# Patient Record
Sex: Male | Born: 1995 | Race: White | Hispanic: No | Marital: Single | Smoking: Current every day smoker
Health system: Southern US, Community
[De-identification: ages and names within clinical notes are randomized; demographics above are authoritative.]

## PROBLEM LIST (undated history)

## (undated) DIAGNOSIS — A4902 Methicillin resistant Staphylococcus aureus infection, unspecified site: Secondary | ICD-10-CM

## (undated) HISTORY — PX: TYMPANOSTOMY: SHX2586

---
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
## 2017-01-28 ENCOUNTER — Encounter (INDEPENDENT_AMBULATORY_CARE_PROVIDER_SITE_OTHER): Payer: Self-pay | Admitting: Orthopaedic Surgery

## 2017-01-28 ENCOUNTER — Ambulatory Visit (INDEPENDENT_AMBULATORY_CARE_PROVIDER_SITE_OTHER): Payer: Managed Care, Other (non HMO)

## 2017-01-28 ENCOUNTER — Ambulatory Visit (INDEPENDENT_AMBULATORY_CARE_PROVIDER_SITE_OTHER): Payer: Self-pay

## 2017-01-28 ENCOUNTER — Ambulatory Visit (INDEPENDENT_AMBULATORY_CARE_PROVIDER_SITE_OTHER): Payer: Managed Care, Other (non HMO) | Admitting: Orthopaedic Surgery

## 2017-01-28 VITALS — BP 97/63 | HR 82 | Ht 73.0 in | Wt 155.0 lb

## 2017-01-28 DIAGNOSIS — M25531 Pain in right wrist: Secondary | ICD-10-CM | POA: Diagnosis not present

## 2017-01-28 DIAGNOSIS — Z0189 Encounter for other specified special examinations: Secondary | ICD-10-CM
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
## 2017-02-02 ENCOUNTER — Encounter: Payer: Self-pay | Admitting: Orthopaedic Surgery

## 2017-02-04 ENCOUNTER — Ambulatory Visit (INDEPENDENT_AMBULATORY_CARE_PROVIDER_SITE_OTHER): Payer: Medicaid Other | Admitting: Orthopaedic Surgery

## 2017-02-05 ENCOUNTER — Telehealth (INDEPENDENT_AMBULATORY_CARE_PROVIDER_SITE_OTHER): Payer: Self-pay | Admitting: Orthopaedic Surgery

## 2017-02-05 MED ORDER — ACETAMINOPHEN-CODEINE #3 300-30 MG PO TABS: 1.0000 | ORAL_TABLET | Freq: Four times a day (QID) | ORAL | 0 refills | Status: DC | PRN

## 2017-02-05 NOTE — Telephone Encounter (Signed)
Called to pharmacy. Patient advised. He has not heard from Dr. Merlyn Lot office. I will call them about appt on Monday.

## 2017-02-05 NOTE — Telephone Encounter (Signed)
I left voicemail for patient. Steward Drone from the [facility] sent patient's information to hand center yesterday for emergent appt per Dr. Ophelia Charter. I need to know from patient if he has been scheduled there yet, etc.

## 2017-02-05 NOTE — Telephone Encounter (Signed)
Ok to call in tylenol # 3    # 20  1 po q 6 hrs prn pain  . thanks

## 2017-02-05 NOTE — Telephone Encounter (Signed)
Unable to reach patient at number left. I called [facility] at [phone] and office was closed for the day. Please advise on medication.

## 2017-02-05 NOTE — Addendum Note (Signed)
Addended by: Rogers Seeds on: 02/05/2017 05:09 PM   Modules accepted: Orders

## 2017-02-05 NOTE — Telephone Encounter (Signed)
PT ASKED IF THERE'S ANYTHING HE CAN HAVE FOR PAIN.  PLEASE ADVISE.  [phone]

## 2017-02-17 ENCOUNTER — Telehealth (INDEPENDENT_AMBULATORY_CARE_PROVIDER_SITE_OTHER): Payer: Self-pay | Admitting: *Deleted

## 2017-02-17 NOTE — Telephone Encounter (Signed)
Received call from [facility] stating pt was seen for MRI on Aug 13 that was ordered from the [facility] office by Dr. Ophelia Charter and needed an authorization from [employer], I submitted authorization which went into medical review with case # [id number], all pertinent information was uploaded and sent to [employer]. Pending auth.   Victorino Dike would like authorization to be send to her once authorized to [phone]

## 2017-02-23 NOTE — Telephone Encounter (Signed)
Received fax from [employer] stating MRI upper extremity RT is not covered, services was not medically necessary, states pt has to have at least 6 weeks trial of treatment, which includes, RICE, Drugs, oral steroids or injected steriods, or at home exercise.

## 2017-03-11 ENCOUNTER — Other Ambulatory Visit: Payer: Self-pay | Admitting: Orthopedic Surgery

## 2017-03-11 DIAGNOSIS — M25332 Other instability, left wrist: Secondary | ICD-10-CM

## 2017-03-11 DIAGNOSIS — M25331 Other instability, right wrist: Secondary | ICD-10-CM

## 2017-03-19 ENCOUNTER — Other Ambulatory Visit: Payer: Self-pay

## 2017-11-29 ENCOUNTER — Other Ambulatory Visit: Payer: Self-pay

## 2017-11-29 ENCOUNTER — Emergency Department (HOSPITAL_COMMUNITY)
Admission: EM | Admit: 2017-11-29 | Discharge: 2017-11-30 | Disposition: A | Discharge status: Home / Self Care | Payer: Managed Care, Other (non HMO) | Attending: Emergency Medicine | Admitting: Emergency Medicine

## 2017-11-29 ENCOUNTER — Encounter (HOSPITAL_COMMUNITY): Payer: Self-pay | Admitting: Emergency Medicine

## 2017-11-29 DIAGNOSIS — Y999 Unspecified external cause status: Secondary | ICD-10-CM | POA: Diagnosis not present

## 2017-11-29 DIAGNOSIS — S93402A Sprain of unspecified ligament of left ankle, initial encounter: Secondary | ICD-10-CM | POA: Diagnosis not present

## 2017-11-29 DIAGNOSIS — S99912A Unspecified injury of left ankle, initial encounter: Secondary | ICD-10-CM | POA: Diagnosis present

## 2017-11-29 DIAGNOSIS — Y9301 Activity, walking, marching and hiking: Secondary | ICD-10-CM | POA: Insufficient documentation

## 2017-11-29 DIAGNOSIS — F17210 Nicotine dependence, cigarettes, uncomplicated: Secondary | ICD-10-CM | POA: Diagnosis not present

## 2017-11-29 DIAGNOSIS — X500XXA Overexertion from strenuous movement or load, initial encounter: Secondary | ICD-10-CM | POA: Diagnosis not present

## 2017-11-29 DIAGNOSIS — Z79899 Other long term (current) drug therapy: Secondary | ICD-10-CM | POA: Diagnosis not present

## 2017-11-29 DIAGNOSIS — Y92832 Beach as the place of occurrence of the external cause: Secondary | ICD-10-CM | POA: Diagnosis not present
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
## 2017-11-30 ENCOUNTER — Emergency Department (HOSPITAL_COMMUNITY): Payer: Managed Care, Other (non HMO)

## 2017-11-30 DIAGNOSIS — S93402A Sprain of unspecified ligament of left ankle, initial encounter: Secondary | ICD-10-CM | POA: Diagnosis not present

## 2017-11-30 MED ORDER — HYDROCODONE-ACETAMINOPHEN 5-325 MG PO TABS
1.0000 | ORAL_TABLET | Freq: Once | ORAL | Status: AC
  Administered 2017-11-30: 1 via ORAL
  Filled 2017-11-30: qty 1

## 2017-11-30 MED ORDER — HYDROCODONE-ACETAMINOPHEN 5-325 MG PO TABS: ORAL_TABLET | ORAL | 0 refills | Status: DC

## 2017-11-30 NOTE — ED Provider Notes (Signed)
[facility] EMERGENCY DEPARTMENT Provider Note   CSN: [id number] Arrival date & time: 11/29/17  2213     History   Chief Complaint Chief Complaint  Patient presents with  . Ankle Pain    HPI Michael Welch is a 22 y.o. male.  HPI   Michael Welch is a 22 y.o. male who presents to the Emergency Department complaining of left ankle pain and swelling for one week.  He developed pain to his left ankle after walking [place] the beach.  He was seen at another ER and told he had a cellulitis to his ankle and given antibiotic and ibuprofen which have not helped his symptoms.  He has continued to walk and work, and now complains of increased pain to the ankle and swelling.  He denies open wounds or sore to the foot, numbness, calf pain or swelling    History reviewed. No pertinent past medical history.  Patient Active Problem List   Diagnosis Date Noted  . Right wrist pain 01/28/2017    History reviewed. No pertinent surgical history.      Home Medications    Prior to Admission medications   Medication Sig Start Date End Date Taking? Authorizing Provider  acetaminophen-codeine (TYLENOL #3) 300-30 MG tablet Take 1 tablet by mouth every 6 (six) hours as needed for moderate pain. 02/05/17   Eldred Manges, MD  amphetamine-dextroamphetamine (ADDERALL XR) 30 MG 24 hr capsule Take 30 mg by mouth daily.    [provider]  cephALEXin (KEFLEX) 500 MG capsule 1 tab po qid x 7 days Patient not taking: Reported on 11/19/2014 02/10/13   Viviano Simas, NP  HYDROcodone-acetaminophen (NORCO/VICODIN) 5-325 MG per tablet Take 1 tablet by mouth every 4 (four) hours as needed. 11/20/14   Janne Napoleon, NP  naproxen (NAPROSYN) 500 MG tablet Take 1 tablet (500 mg total) by mouth 2 (two) times daily. Patient not taking: Reported on 01/28/2017 11/20/14   Janne Napoleon, NP    Family History History reviewed. No pertinent family history.  Social History Social History   Tobacco Use    . Smoking status: Current Every Day Smoker    Packs/day: 0.50  . Smokeless tobacco: Never Used  Substance Use Topics  . Alcohol use: No  . Drug use: No     Allergies   Sulfa antibiotics   Review of Systems Review of Systems  Constitutional: Negative for chills and fever.  Musculoskeletal: Positive for arthralgias (left ankle pain and swelling) and joint swelling.  Skin: Negative for color change and wound.  Neurological: Negative for weakness and numbness.  All other systems reviewed and are negative.    Physical Exam Updated Vital Signs BP 134/84 (BP Location: Right Arm)   Pulse 90   Temp 98.5 F (36.9 C) (Oral)   Resp 16   Ht 6\' 1"  (1.854 m)   Wt 76.2 kg (168 lb)   SpO2 98%   BMI 22.16 kg/m   Physical Exam  Constitutional: He is oriented to person, place, and time. He appears well-developed and well-nourished. No distress.  HENT:  Head: Normocephalic and atraumatic.  Cardiovascular: Normal rate, regular rhythm and intact distal pulses.  Pulmonary/Chest: Effort normal and breath sounds normal.  Musculoskeletal: He exhibits edema and tenderness. He exhibits no deformity.  Diffuse tenderness of the medial and lateral ankle.  Mild erythema of the lateral ankle w/o excessive warmth.  No open wounds or lymphangitis.  No proximal tenderness. Compartments are soft.  Neurological: He is alert and oriented to person, place, and time. He exhibits normal muscle tone. Coordination normal.  Skin: Skin is warm and dry. Capillary refill takes less than 2 seconds.  Nursing note and vitals reviewed.    ED Treatments / Results  Labs (all labs ordered are listed, but only abnormal results are displayed) Labs Reviewed - No data to display  EKG None  Radiology Dg Ankle Complete Left  Result Date: 11/30/2017 CLINICAL DATA:  Left ankle pain for 1 week.  Swelling. EXAM: LEFT ANKLE COMPLETE - 3+ VIEW COMPARISON:  [facility]. FINDINGS: There is no  evidence of fracture or dislocation. Ankle mortise is preserved. Joint effusion is now visualized. There is no evidence of arthropathy or other focal bone abnormality. Mild soft tissue edema compared to prior exam. IMPRESSION: Joint effusion and soft tissue edema.  No acute osseous abnormality. Electronically Signed   By: Rubye OaksMelanie  Ehinger M.D.   On: 11/30/2017 01:03    Procedures Procedures (including critical care time)  Medications Ordered in ED Medications  HYDROcodone-acetaminophen (NORCO/VICODIN) 5-325 MG per tablet 1 tablet (has no administration in time range)     Initial Impression / Assessment and Plan / ED Course  I have reviewed the triage vital signs and the nursing notes.  Pertinent labs & imaging results that were available during my care of the patient were reviewed by me and considered in my medical decision making (see chart for details).     XR here neg for fx. but findings show effusion which may suggest sprain.  NV intact.  No proximal edema or tenderness.  Placed in ASO and crutches given.  He agrees to RICE therapy and close orthopedic f/u in one week if not improving  Final Clinical Impressions(s) / ED Diagnoses   Final diagnoses:  Sprain of left ankle, unspecified ligament, initial encounter    ED Discharge Orders    None       Pauline Ausriplett, Deneane Stifter, PA-C 11/30/17 0137    Devoria AlbeKnapp, Iva, MD 11/30/17 [phone]

## 2017-11-30 NOTE — Discharge Instructions (Addendum)
Elevate and apply ice packs on/off to your ankle.  Use the crutches for weight bearing.  Call Dr. Mort SawyersHarrison's office in one week if not improving

## 2017-12-04 ENCOUNTER — Emergency Department (HOSPITAL_COMMUNITY): Payer: Managed Care, Other (non HMO)

## 2017-12-04 ENCOUNTER — Emergency Department (HOSPITAL_COMMUNITY)
Admission: EM | Admit: 2017-12-04 | Discharge: 2017-12-05 | Disposition: A | Discharge status: Home / Self Care | Payer: Managed Care, Other (non HMO) | Attending: Emergency Medicine | Admitting: Emergency Medicine

## 2017-12-04 ENCOUNTER — Other Ambulatory Visit: Payer: Self-pay

## 2017-12-04 ENCOUNTER — Encounter (HOSPITAL_COMMUNITY): Payer: Self-pay | Admitting: Emergency Medicine

## 2017-12-04 DIAGNOSIS — M25572 Pain in left ankle and joints of left foot: Secondary | ICD-10-CM | POA: Insufficient documentation

## 2017-12-04 DIAGNOSIS — M25472 Effusion, left ankle: Secondary | ICD-10-CM

## 2017-12-04 DIAGNOSIS — F17210 Nicotine dependence, cigarettes, uncomplicated: Secondary | ICD-10-CM | POA: Diagnosis not present

## 2017-12-04 DIAGNOSIS — R2242 Localized swelling, mass and lump, left lower limb: Secondary | ICD-10-CM | POA: Diagnosis not present

## 2017-12-04 LAB — CBC WITH DIFFERENTIAL/PLATELET
BASOS PCT: 0 %
Basophils Absolute: 0 10*3/uL (ref 0.0–0.1)
EOS ABS: 0.1 10*3/uL (ref 0.0–0.7)
Eosinophils Relative: 1 %
HCT: 43 % (ref 39.0–52.0)
HEMOGLOBIN: 14.8 g/dL (ref 13.0–17.0)
Lymphocytes Relative: 20 %
Lymphs Abs: 1.9 10*3/uL (ref 0.7–4.0)
MCH: 31.2 pg (ref 26.0–34.0)
MCHC: 34.4 g/dL (ref 30.0–36.0)
MCV: 90.5 fL (ref 78.0–100.0)
MONOS PCT: 13 %
Monocytes Absolute: 1.3 10*3/uL — ABNORMAL HIGH (ref 0.1–1.0)
NEUTROS PCT: 66 %
Neutro Abs: 6.3 10*3/uL (ref 1.7–7.7)
Platelets: 268 10*3/uL (ref 150–400)
RBC: 4.75 MIL/uL (ref 4.22–5.81)
RDW: 12.7 % (ref 11.5–15.5)
WBC: 9.6 10*3/uL (ref 4.0–10.5)

## 2017-12-04 LAB — BASIC METABOLIC PANEL
Anion gap: 10 (ref 5–15)
BUN: 9 mg/dL (ref 6–20)
CHLORIDE: 99 mmol/L — AB (ref 101–111)
CO2: 30 mmol/L (ref 22–32)
CREATININE: 0.83 mg/dL (ref 0.61–1.24)
Calcium: 9.7 mg/dL (ref 8.9–10.3)
GFR calc non Af Amer: >60 mL/min (ref >60)
Glucose, Bld: 87 mg/dL (ref 65–99)
Potassium: 3.7 mmol/L (ref 3.5–5.1)
SODIUM: 139 mmol/L (ref 135–145)

## 2017-12-04 LAB — URIC ACID: URIC ACID, SERUM: 6.3 mg/dL (ref 4.4–7.6)

## 2017-12-04 MED ORDER — HYDROCODONE-ACETAMINOPHEN 5-325 MG PO TABS
1.0000 | ORAL_TABLET | Freq: Once | ORAL | Status: AC
  Administered 2017-12-04: 1 via ORAL
  Filled 2017-12-04: qty 1

## 2017-12-04 NOTE — ED Triage Notes (Signed)
Pt c/o foot pain and swelling to left foot since 11/23/17, [facility] and here for same, pt has taken all of Doxycycline, left foot has continued to swell and be painful with no relief

## 2017-12-04 NOTE — ED Notes (Signed)
Pt with continued swelling to left foot and ankle, redness noted to left ankle.  Pt states took last dose of doxycycline today and believes it was a 10 day course.  Unable to state if fever at home.

## 2017-12-04 NOTE — ED Provider Notes (Signed)
[facility] EMERGENCY DEPARTMENT Provider Note   CSN: [id number] Arrival date & time: 12/04/17  2126     History   Chief Complaint Chief Complaint  Patient presents with  . Foot Pain    HPI Michael Welch is a 22 y.o. male.  The history is provided by the patient.  He comes in with ongoing pain and swelling in the left foot and ankle.  He had onset pain and swelling in his left ankle about 1 week ago.  [place], [place], noted that there was some pain and swelling in his ankle.  [facility] where x-rays of his ankle were negative and he was diagnosed with cellulitis and started on cephalexin.  He came here 5 days ago because of lack of improvement, x-rays were again negative, no change in antibiotics at that time.  Since then, he states the pain and swelling of back to gotten worse.  He rates pain at 10/10.  Is worse with palpation and with bearing weight.  He denies fever or chills, but he did have sweats last night.  He has been taking ibuprofen and a muscle relaxer, which only gives him slight relief.  History reviewed. No pertinent past medical history.  Patient Active Problem List   Diagnosis Date Noted  . Right wrist pain 01/28/2017    History reviewed. No pertinent surgical history.      Home Medications    Prior to Admission medications   Medication Sig Start Date End Date Taking? Authorizing Provider  amphetamine-dextroamphetamine (ADDERALL XR) 30 MG 24 hr capsule Take 30 mg by mouth daily.    [provider]  cephALEXin (KEFLEX) 500 MG capsule 1 tab po qid x 7 days Patient not taking: Reported on 11/19/2014 02/10/13   Viviano Simasobinson, Lauren, NP  HYDROcodone-acetaminophen (NORCO/VICODIN) 5-325 MG tablet Take one tab po q 4 hrs prn pain 11/30/17   Pauline Ausriplett, Tammy, PA-C    Family History History reviewed. No pertinent family history.  Social History Social History   Tobacco Use  . Smoking status: Current Every  Day Smoker    Packs/day: 0.50  . Smokeless tobacco: Never Used  Substance Use Topics  . Alcohol use: No  . Drug use: No     Allergies   Sulfa antibiotics   Review of Systems Review of Systems  All other systems reviewed and are negative.    Physical Exam Updated Vital Signs BP 127/79 (BP Location: Right Arm)   Pulse 90   Temp 98.6 F (37 C) (Oral)   Ht 6\' 1"  (1.854 m)   Wt 77.1 kg (170 lb)   SpO2 99%   BMI 22.43 kg/m   Physical Exam  Nursing note and vitals reviewed.  22 year old male, resting comfortably and in no acute distress. Vital signs are normal. Oxygen saturation is 99%, which is normal. Head is normocephalic and atraumatic. PERRLA, EOMI. Oropharynx is clear. Neck is nontender and supple without adenopathy or JVD. Back is nontender and there is no CVA tenderness. Lungs are clear without rales, wheezes, or rhonchi. Chest is nontender. Heart has regular rate and rhythm without murmur. Abdomen is soft, flat, nontender without masses or hepatosplenomegaly and peristalsis is normoactive. Extremities: Mild to moderate soft tissue swelling of the left foot and ankle.  Mild erythema over most of the left foot and ankle with moderate erythema laterally.  There is slight warmth.  Left foot and ankle are tender diffusely.  There are no lymphangitic streaks.  There is no calf swelling or tenderness. Skin is warm and dry without rash. Neurologic: Mental status is normal, cranial nerves are intact, there are no motor or sensory deficits.  ED Treatments / Results  Labs (all labs ordered are listed, but only abnormal results are displayed) Labs Reviewed  CBC WITH DIFFERENTIAL/PLATELET - Abnormal; Notable for the following components:      Result Value   Monocytes Absolute 1.3 (*)    All other components within normal limits  BASIC METABOLIC PANEL - Abnormal; Notable for the following components:   Chloride 99 (*)    All other components within normal limits    SEDIMENTATION RATE - Abnormal; Notable for the following components:   Sed Rate 26 (*)    All other components within normal limits  URIC ACID   Radiology Dg Foot Complete Left  Result Date: 12/04/2017 CLINICAL DATA:  Pain and swelling in the foot EXAM: LEFT FOOT - COMPLETE 3+ VIEW COMPARISON:  11/30/2017 FINDINGS: There is no evidence of fracture or dislocation. There is no evidence of arthropathy or other focal bone abnormality. Soft tissues are unremarkable. IMPRESSION: Negative. Electronically Signed   By: Jasmine Pang M.D.   On: 12/04/2017 23:43    Procedures Procedures  Medications Ordered in ED Medications  clindamycin (CLEOCIN) capsule 300 mg (has no administration in time range)  predniSONE (DELTASONE) tablet 60 mg (has no administration in time range)  HYDROcodone-acetaminophen (NORCO/VICODIN) 5-325 MG per tablet 1 tablet (1 tablet Oral Given 12/04/17 2330)     Initial Impression / Assessment and Plan / ED Course  I have reviewed the triage vital signs and the nursing notes.  Pertinent labs & imaging results that were available during my care of the patient were reviewed by me and considered in my medical decision making (see chart for details).  Pain and swelling in the left foot and ankle which have not responded to what should have been appropriate antibiotics for cellulitis.  Old records are reviewed confirming ED visit 5 days ago at which time ankle x-rays were negative and he was discharged with prescription for hydrocodone-acetaminophen.  Of note, x-rays from 5 days ago did suggest joint effusion.  I wonder if this is actually gout.  Will check serum uric acid level and sedimentation rate.  We will also check x-ray of the foot, since but has not been x-rayed previously.  Today, CBC is normal with normal WBC and normal differential.  If this is cellulitis, he may need to be switched to an antibiotic that would be appropriate for MRSA.  Uric acid is normal.   Sedimentation rate is only minimally elevated.  Foot x-rays are unremarkable except for soft tissue swelling.  At this point, no indication for hospital admission.  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  He will be discharged with prescription for clindamycin as well as a short course of prednisone.  Given additional hydrocodone-acetaminophen to use for pain control until inflammation comes under control.  Advised to return to the emergency department if not improving in 2 days, otherwise, follow-up with PCP.  Final Clinical Impressions(s) / ED Diagnoses   Final diagnoses:  Pain and swelling of left ankle    ED Discharge Orders        Ordered    clindamycin (CLEOCIN) 300 MG capsule  4 times daily     12/05/17 0053  predniSONE (DELTASONE) 50 MG tablet  Daily     12/05/17 0053    HYDROcodone-acetaminophen (NORCO/VICODIN) 5-325 MG tablet     12/05/17 0053    HYDROcodone-acetaminophen (NORCO) 5-325 MG tablet  Every 4 hours PRN     12/05/17 0053       Dione Booze, MD 12/05/17 [phone]

## 2017-12-05 LAB — SEDIMENTATION RATE: Sed Rate: 26 mm/hr — ABNORMAL HIGH (ref 0–16)

## 2017-12-05 MED ORDER — HYDROCODONE-ACETAMINOPHEN 5-325 MG PO TABS: ORAL_TABLET | ORAL | 0 refills | Status: DC

## 2017-12-05 MED ORDER — PREDNISONE 50 MG PO TABS
60.0000 mg | ORAL_TABLET | Freq: Once | ORAL | Status: DC
  Filled 2017-12-05: qty 1

## 2017-12-05 MED ORDER — HYDROCODONE-ACETAMINOPHEN 5-325 MG PO TABS: 1.0000 | ORAL_TABLET | ORAL | 0 refills | Status: DC | PRN

## 2017-12-05 MED ORDER — PREDNISONE 50 MG PO TABS: 50.0000 mg | ORAL_TABLET | Freq: Every day | ORAL | 0 refills | Status: DC

## 2017-12-05 MED ORDER — CLINDAMYCIN HCL 300 MG PO CAPS: 300.0000 mg | ORAL_CAPSULE | Freq: Four times a day (QID) | ORAL | 0 refills | Status: DC

## 2017-12-05 MED ORDER — CLINDAMYCIN HCL 150 MG PO CAPS
300.0000 mg | ORAL_CAPSULE | Freq: Once | ORAL | Status: DC
  Filled 2017-12-05: qty 2

## 2017-12-05 NOTE — Discharge Instructions (Signed)
At this point, I am not sure if you truly have cellulitis (an infection in the skin) or gout.  Treatment is being given for both.  Please take the new antibiotic as prescribed, take the prednisone as prescribed.  If your foot is not showing any improvement in the next 2 days, return to the emergency department for consideration for hospital admission.

## 2017-12-06 ENCOUNTER — Encounter (HOSPITAL_COMMUNITY): Payer: Self-pay | Admitting: Emergency Medicine

## 2017-12-06 ENCOUNTER — Other Ambulatory Visit: Payer: Self-pay

## 2017-12-06 ENCOUNTER — Inpatient Hospital Stay (HOSPITAL_COMMUNITY)
Admission: EM | Admit: 2017-12-06 | Discharge: 2017-12-08 | DRG: 488 | Disposition: A | Discharge status: Home / Self Care | Payer: Managed Care, Other (non HMO) | Attending: Internal Medicine | Admitting: Internal Medicine

## 2017-12-06 DIAGNOSIS — A549 Gonococcal infection, unspecified: Secondary | ICD-10-CM | POA: Clinically undetermined

## 2017-12-06 DIAGNOSIS — Z882 Allergy status to sulfonamides status: Secondary | ICD-10-CM

## 2017-12-06 DIAGNOSIS — M130 Polyarthritis, unspecified: Secondary | ICD-10-CM | POA: Diagnosis not present

## 2017-12-06 DIAGNOSIS — R6510 Systemic inflammatory response syndrome (SIRS) of non-infectious origin without acute organ dysfunction: Secondary | ICD-10-CM | POA: Diagnosis present

## 2017-12-06 DIAGNOSIS — M25462 Effusion, left knee: Secondary | ICD-10-CM | POA: Diagnosis present

## 2017-12-06 DIAGNOSIS — M009 Pyogenic arthritis, unspecified: Secondary | ICD-10-CM | POA: Diagnosis not present

## 2017-12-06 DIAGNOSIS — R739 Hyperglycemia, unspecified: Secondary | ICD-10-CM | POA: Diagnosis present

## 2017-12-06 DIAGNOSIS — R7989 Other specified abnormal findings of blood chemistry: Secondary | ICD-10-CM

## 2017-12-06 DIAGNOSIS — M25569 Pain in unspecified knee: Secondary | ICD-10-CM

## 2017-12-06 DIAGNOSIS — Z8619 Personal history of other infectious and parasitic diseases: Secondary | ICD-10-CM

## 2017-12-06 DIAGNOSIS — M25472 Effusion, left ankle: Secondary | ICD-10-CM | POA: Diagnosis present

## 2017-12-06 DIAGNOSIS — F17210 Nicotine dependence, cigarettes, uncomplicated: Secondary | ICD-10-CM | POA: Diagnosis present

## 2017-12-06 DIAGNOSIS — K921 Melena: Secondary | ICD-10-CM | POA: Diagnosis present

## 2017-12-06 HISTORY — DX: Methicillin resistant Staphylococcus aureus infection, unspecified site: A49.02

## 2017-12-06 LAB — CBC WITH DIFFERENTIAL/PLATELET
Abs Immature Granulocytes: 0.1 10*3/uL (ref 0.0–0.1)
BASOS PCT: 0 %
Basophils Absolute: 0 10*3/uL (ref 0.0–0.1)
EOS ABS: 0 10*3/uL (ref 0.0–0.7)
EOS PCT: 0 %
HEMATOCRIT: 45.1 % (ref 39.0–52.0)
Hemoglobin: 14.8 g/dL (ref 13.0–17.0)
Immature Granulocytes: 0 %
LYMPHS ABS: 0.8 10*3/uL (ref 0.7–4.0)
Lymphocytes Relative: 6 %
MCH: 29.8 pg (ref 26.0–34.0)
MCHC: 32.8 g/dL (ref 30.0–36.0)
MCV: 90.7 fL (ref 78.0–100.0)
MONOS PCT: 6 %
Monocytes Absolute: 0.8 10*3/uL (ref 0.1–1.0)
Neutro Abs: 12.3 10*3/uL — ABNORMAL HIGH (ref 1.7–7.7)
Neutrophils Relative %: 88 %
Platelets: 331 10*3/uL (ref 150–400)
RBC: 4.97 MIL/uL (ref 4.22–5.81)
RDW: 12.4 % (ref 11.5–15.5)
WBC: 13.9 10*3/uL — AB (ref 4.0–10.5)

## 2017-12-06 LAB — COMPREHENSIVE METABOLIC PANEL
ALK PHOS: 63 U/L (ref 38–126)
ALT: 17 U/L (ref 17–63)
AST: 19 U/L (ref 15–41)
Albumin: 4.1 g/dL (ref 3.5–5.0)
Anion gap: 11 (ref 5–15)
BUN: 13 mg/dL (ref 6–20)
CALCIUM: 10.1 mg/dL (ref 8.9–10.3)
CO2: 28 mmol/L (ref 22–32)
CREATININE: 0.92 mg/dL (ref 0.61–1.24)
Chloride: 101 mmol/L (ref 101–111)
GFR calc Af Amer: >60 mL/min (ref >60)
Glucose, Bld: 156 mg/dL — ABNORMAL HIGH (ref 65–99)
Potassium: 4 mmol/L (ref 3.5–5.1)
Sodium: 140 mmol/L (ref 135–145)
Total Bilirubin: 0.9 mg/dL (ref 0.3–1.2)
Total Protein: 8 g/dL (ref 6.5–8.1)

## 2017-12-06 LAB — I-STAT CG4 LACTIC ACID, ED
Lactic Acid, Venous: 1.98 mmol/L — ABNORMAL HIGH (ref 0.5–1.9)
Lactic Acid, Venous: 2.81 mmol/L (ref 0.5–1.9)

## 2017-12-06 MED ORDER — VANCOMYCIN HCL IN DEXTROSE 1-5 GM/200ML-% IV SOLN: 1000.0000 mg | Freq: Once | INTRAVENOUS | Status: DC

## 2017-12-06 MED ORDER — VANCOMYCIN HCL 10 G IV SOLR
1500.0000 mg | Freq: Once | INTRAVENOUS | Status: AC
  Administered 2017-12-07: 1500 mg via INTRAVENOUS
  Filled 2017-12-06: qty 1500

## 2017-12-06 MED ORDER — PIPERACILLIN-TAZOBACTAM 3.375 G IVPB 30 MIN
3.3750 g | Freq: Once | INTRAVENOUS | Status: AC
  Administered 2017-12-07: 3.375 g via INTRAVENOUS
  Filled 2017-12-06: qty 50

## 2017-12-06 MED ORDER — SODIUM CHLORIDE 0.9 % IV BOLUS (SEPSIS)
500.0000 mL | Freq: Once | INTRAVENOUS | Status: AC
  Administered 2017-12-07: 500 mL via INTRAVENOUS

## 2017-12-06 MED ORDER — SODIUM CHLORIDE 0.9 % IV BOLUS (SEPSIS)
1000.0000 mL | Freq: Once | INTRAVENOUS | Status: AC
  Administered 2017-12-06: 1000 mL via INTRAVENOUS

## 2017-12-06 MED ORDER — SODIUM CHLORIDE 0.9 % IV BOLUS (SEPSIS)
1000.0000 mL | Freq: Once | INTRAVENOUS | Status: AC
  Administered 2017-12-07: 1000 mL via INTRAVENOUS

## 2017-12-06 MED FILL — Hydrocodone-Acetaminophen Tab 5-325 MG: ORAL | Qty: 6 | Status: AC

## 2017-12-06 NOTE — ED Notes (Signed)
Kim-RN @[facility]  notified of elevated CG-4 of 2.81

## 2017-12-06 NOTE — ED Triage Notes (Signed)
Pt presents with L leg pain and swelling X several days. Pt has been seem multiple times for same at [facility] [place] and [facility], dx with possible cellulitis vs gout vs join effusion. Pt given prednisone, abx, hydrocodone and instructed to follow up with ortho but has not. Pt states he feels like the prior hospitals "dont know what they are talking about"

## 2017-12-06 NOTE — ED Provider Notes (Signed)
[facility] Provider Note   CSN: [id number] Arrival date & time: 12/06/17  2105     History   Chief Complaint Chief Complaint  Patient presents with  . Leg Pain    HPI Michael Welch is a 22 y.o. male.  The history is provided by the patient.  He has been basically healthy until about 10 days ago when he developed pain in his left foot and ankle.  He went to [facility] emergency department at [facility] [place] where he was diagnosed with cellulitis and started on cephalexin.  He did not have any improvement and went to the emergency department at [facility] where he was treated for possible ankle sprain.  He returned to any pain emergency department where antibiotics were switched to doxycycline and he was also put on prednisone to treat possible gout.  Since that change, his foot has improved.  Erythema has gone down and swelling has gone down.  However, he now has pain and swelling and discoloration of his left knee.  He rates pain at 8/10.  He denies fever, chills, sweats.  He denies cough.  He denies vomiting or diarrhea.  Knee pain is worse with palpation and worse with movement.  History reviewed. No pertinent past medical history.  Patient Active Problem List   Diagnosis Date Noted  . Right wrist pain 01/28/2017    History reviewed. No pertinent surgical history.      Home Medications    Prior to Admission medications   Medication Sig Start Date End Date Taking? Authorizing Provider  amphetamine-dextroamphetamine (ADDERALL XR) 30 MG 24 hr capsule Take 30 mg by mouth daily.    [provider]  clindamycin (CLEOCIN) 300 MG capsule Take 1 capsule (300 mg total) by mouth 4 (four) times daily. X 7 days 12/05/17   Dione BoozeGlick, Olena Willy, MD  HYDROcodone-acetaminophen [facility](NORCO) 5-325 MG tablet Take 1 tablet by mouth every 4 (four) hours as needed for moderate pain. 12/05/17   Dione BoozeGlick, Devoiry Corriher, MD  HYDROcodone-acetaminophen (NORCO/VICODIN) 5-325 MG  tablet Take one tab po q 4 hrs prn pain Patient not taking: Reported on 12/06/2017 12/05/17   Dione BoozeGlick, Joliet Mallozzi, MD  predniSONE (DELTASONE) 50 MG tablet Take 1 tablet (50 mg total) by mouth daily. 12/05/17   Dione BoozeGlick, Beola Vasallo, MD    Family History No family history on file.  Social History Social History   Tobacco Use  . Smoking status: Current Every Day Smoker    Packs/day: 0.50  . Smokeless tobacco: Never Used  Substance Use Topics  . Alcohol use: No  . Drug use: No     Allergies   Sulfa antibiotics   Review of Systems Review of Systems  All other systems reviewed and are negative.    Physical Exam Updated Vital Signs BP 116/77 (BP Location: Right Arm)   Pulse (!) 119   Temp 98.3 F (36.8 C) (Oral)   Resp 18   Ht 6\' 1"  (1.854 m)   Wt 77.1 kg (170 lb)   SpO2 98%   BMI 22.43 kg/m   Physical Exam  Nursing note and vitals reviewed.  22 year old male, resting comfortably and in no acute distress. Vital signs are significant for tachycardia. Oxygen saturation is 98%, which is normal. Head is normocephalic and atraumatic. PERRLA, EOMI. Oropharynx is clear. Neck is nontender and supple without adenopathy or JVD. Back is nontender and there is no CVA tenderness. Lungs are clear without rales, wheezes, or rhonchi. Chest is nontender. Heart  has regular rate and rhythm without murmur. Abdomen is soft, flat, nontender without masses or hepatosplenomegaly and peristalsis is normoactive. Extremities: Discoloration noted around left knee with moderate effusion present.  Mild tenderness to palpation in the left knee.  Left ankle has mild swelling but no erythema or warmth.  It is minimally tender.  Swelling does extend into the left foot.  Dorsalis pedis pulses strong and capillary refill is prompt. Skin is warm and dry without rash. Neurologic: Mental status is normal, cranial nerves are intact, there are no motor or sensory deficits.  ED Treatments / Results  Labs (all labs ordered  are listed, but only abnormal results are displayed) Labs Reviewed  COMPREHENSIVE METABOLIC PANEL - Abnormal; Notable for the following components:      Result Value   Glucose, Bld 156 (*)    All other components within normal limits  CBC WITH DIFFERENTIAL/PLATELET - Abnormal; Notable for the following components:   WBC 13.9 (*)    Neutro Abs 12.3 (*)    All other components within normal limits  I-STAT CG4 LACTIC ACID, ED - Abnormal; Notable for the following components:   Lactic Acid, Venous 2.81 (*)    All other components within normal limits  CULTURE, BLOOD (ROUTINE X 2)  CULTURE, BLOOD (ROUTINE X 2)  URINALYSIS, ROUTINE W REFLEX MICROSCOPIC  I-STAT CG4 LACTIC ACID, ED    EKG EKG Interpretation  Date/Time:  Tuesday December 07 2017 00:39:23 EDT Ventricular Rate:  75 PR Interval:    QRS Duration: 91 QT Interval:  389 QTC Calculation: 435 R Axis:   77 Text Interpretation:  Atrial fibrillation Borderline Q waves in lateral leads When compared with ECG of 10/13/2010, QT has shortened Confirmed by Dione Booze ([id number]) on 12/07/2017 12:51:58 AM   Radiology Dg Foot Complete Left  Result Date: 12/04/2017 CLINICAL DATA:  Pain and swelling in the foot EXAM: LEFT FOOT - COMPLETE 3+ VIEW COMPARISON:  11/30/2017 FINDINGS: There is no evidence of fracture or dislocation. There is no evidence of arthropathy or other focal bone abnormality. Soft tissues are unremarkable. IMPRESSION: Negative. Electronically Signed   By: Jasmine Pang M.D.   On: 12/04/2017 23:43    Procedures .Joint Aspiration/Arthrocentesis Date/Time: 12/07/2017 12:49 AM Performed by: Dione Booze, MD Authorized by: Dione Booze, MD   Consent:    Consent obtained:  Verbal   Consent given by:  Patient   Risks discussed:  Bleeding, infection and pain   Alternatives discussed:  No treatment Location:    Location:  Knee   Knee:  L knee Anesthesia (see MAR for exact dosages):    Anesthesia method:  Local infiltration    Local anesthetic:  Lidocaine 1% w/o epi Procedure details:    Preparation: Patient was prepped and draped in usual sterile fashion     Needle gauge:  18 G   Ultrasound guidance: no     Approach:  Medial   Aspirate amount:  20 ml   Aspirate characteristics:  Cloudy   Steroid injected: no     Specimen collected: yes   Post-procedure details:    Dressing:  Adhesive bandage   Patient tolerance of procedure:  Tolerated with difficulty    Medications Ordered in ED Medications  sodium chloride 0.9 % bolus 1,000 mL (has no administration in time range)    And  sodium chloride 0.9 % bolus 1,000 mL (has no administration in time range)    And  sodium chloride 0.9 % bolus 500 mL (has no administration  in time range)  piperacillin-tazobactam (ZOSYN) IVPB 3.375 g (has no administration in time range)  vancomycin (VANCOCIN) 1,500 mg in sodium chloride 0.9 % 500 mL IVPB (has no administration in time range)     Initial Impression / Assessment and Plan / ED Course  I have reviewed the triage vital signs and the nursing notes.  Pertinent labs & imaging results that were available during my care of the patient were reviewed by me and considered in my medical decision making (see chart for details).  Migratory arthritis.  Lactic acid level had been ordered at triage and is mildly elevated and heart rate is elevated.  Will treat as possible sepsis because of elevated lactic acid, but suspect this is more of an immune process.  He will be admitted because of failure of outpatient management as well as elevated lactic acid level.  He is started on vancomycin and Zosyn.  Case is discussed with Dr. Toniann Fail of [facility], who agrees to admit the patient.  Requests arthrocentesis to be done prior to his seeing the patient.  Knee is aspirated with 18-gauge needle, 20 mL of cloudy fluid drawn and sent to the lab for testing.  Final Clinical Impressions(s) / ED Diagnoses   Final diagnoses:    Polyarthritis  Elevated lactic acid level    ED Discharge Orders    None       Dione Booze, MD 12/07/17 [id number]

## 2017-12-07 ENCOUNTER — Inpatient Hospital Stay (HOSPITAL_COMMUNITY): Payer: Managed Care, Other (non HMO)

## 2017-12-07 ENCOUNTER — Inpatient Hospital Stay (HOSPITAL_COMMUNITY): Payer: Managed Care, Other (non HMO) | Admitting: Anesthesiology

## 2017-12-07 ENCOUNTER — Encounter (HOSPITAL_COMMUNITY)
Admission: EM | Disposition: A | Discharge status: Home / Self Care | Payer: Self-pay | Source: Home / Self Care | Attending: Family Medicine

## 2017-12-07 ENCOUNTER — Encounter (HOSPITAL_COMMUNITY): Payer: Self-pay | Admitting: *Deleted

## 2017-12-07 ENCOUNTER — Other Ambulatory Visit: Payer: Self-pay

## 2017-12-07 DIAGNOSIS — R6510 Systemic inflammatory response syndrome (SIRS) of non-infectious origin without acute organ dysfunction: Secondary | ICD-10-CM | POA: Diagnosis present

## 2017-12-07 DIAGNOSIS — M25472 Effusion, left ankle: Secondary | ICD-10-CM | POA: Diagnosis present

## 2017-12-07 DIAGNOSIS — R7989 Other specified abnormal findings of blood chemistry: Secondary | ICD-10-CM | POA: Diagnosis not present

## 2017-12-07 DIAGNOSIS — K921 Melena: Secondary | ICD-10-CM | POA: Diagnosis present

## 2017-12-07 DIAGNOSIS — M25462 Effusion, left knee: Secondary | ICD-10-CM | POA: Diagnosis present

## 2017-12-07 DIAGNOSIS — F17210 Nicotine dependence, cigarettes, uncomplicated: Secondary | ICD-10-CM | POA: Diagnosis present

## 2017-12-07 DIAGNOSIS — M130 Polyarthritis, unspecified: Secondary | ICD-10-CM | POA: Diagnosis present

## 2017-12-07 DIAGNOSIS — M009 Pyogenic arthritis, unspecified: Secondary | ICD-10-CM | POA: Diagnosis present

## 2017-12-07 DIAGNOSIS — R739 Hyperglycemia, unspecified: Secondary | ICD-10-CM | POA: Diagnosis present

## 2017-12-07 DIAGNOSIS — Z882 Allergy status to sulfonamides status: Secondary | ICD-10-CM | POA: Diagnosis not present

## 2017-12-07 DIAGNOSIS — M0089 Polyarthritis due to other bacteria: Secondary | ICD-10-CM | POA: Diagnosis not present

## 2017-12-07 DIAGNOSIS — Z87438 Personal history of other diseases of male genital organs: Secondary | ICD-10-CM | POA: Diagnosis not present

## 2017-12-07 DIAGNOSIS — Z5321 Procedure and treatment not carried out due to patient leaving prior to being seen by health care provider: Secondary | ICD-10-CM | POA: Diagnosis not present

## 2017-12-07 DIAGNOSIS — M25562 Pain in left knee: Secondary | ICD-10-CM | POA: Diagnosis not present

## 2017-12-07 DIAGNOSIS — B9689 Other specified bacterial agents as the cause of diseases classified elsewhere: Secondary | ICD-10-CM | POA: Diagnosis not present

## 2017-12-07 DIAGNOSIS — A549 Gonococcal infection, unspecified: Secondary | ICD-10-CM | POA: Diagnosis not present

## 2017-12-07 HISTORY — PX: KNEE ARTHROSCOPY: SHX127

## 2017-12-07 HISTORY — PX: ANKLE ARTHROSCOPY: SHX545

## 2017-12-07 LAB — SYNOVIAL CELL COUNT + DIFF, W/ CRYSTALS
CRYSTALS FLUID: NONE SEEN
Lymphocytes-Synovial Fld: 5 % (ref 0–20)
Monocyte-Macrophage-Synovial Fluid: 10 % — ABNORMAL LOW (ref 50–90)
Neutrophil, Synovial: 85 % — ABNORMAL HIGH (ref 0–25)
WBC, Synovial: 81500 /mm3 — ABNORMAL HIGH (ref 0–200)

## 2017-12-07 LAB — URINALYSIS, ROUTINE W REFLEX MICROSCOPIC
BACTERIA UA: NONE SEEN
Bilirubin Urine: NEGATIVE
Glucose, UA: NEGATIVE mg/dL
KETONES UR: NEGATIVE mg/dL
LEUKOCYTES UA: NEGATIVE
Nitrite: NEGATIVE
PROTEIN: NEGATIVE mg/dL
Specific Gravity, Urine: 1.013 (ref 1.005–1.030)
pH: 6 (ref 5.0–8.0)

## 2017-12-07 LAB — CBC WITH DIFFERENTIAL/PLATELET
Abs Immature Granulocytes: 0.1 10*3/uL (ref 0.0–0.1)
BASOS ABS: 0 10*3/uL (ref 0.0–0.1)
BASOS PCT: 0 %
EOS ABS: 0 10*3/uL (ref 0.0–0.7)
EOS PCT: 0 %
HCT: 38.1 % — ABNORMAL LOW (ref 39.0–52.0)
Hemoglobin: 12.6 g/dL — ABNORMAL LOW (ref 13.0–17.0)
Immature Granulocytes: 1 %
LYMPHS PCT: 18 %
Lymphs Abs: 2.3 10*3/uL (ref 0.7–4.0)
MCH: 30.1 pg (ref 26.0–34.0)
MCHC: 33.1 g/dL (ref 30.0–36.0)
MCV: 90.9 fL (ref 78.0–100.0)
MONO ABS: 1.6 10*3/uL — AB (ref 0.1–1.0)
Monocytes Relative: 13 %
Neutro Abs: 8.4 10*3/uL — ABNORMAL HIGH (ref 1.7–7.7)
Neutrophils Relative %: 68 %
PLATELETS: 266 10*3/uL (ref 150–400)
RBC: 4.19 MIL/uL — AB (ref 4.22–5.81)
RDW: 12.4 % (ref 11.5–15.5)
WBC: 12.4 10*3/uL — AB (ref 4.0–10.5)

## 2017-12-07 LAB — LACTIC ACID, PLASMA
LACTIC ACID, VENOUS: 1.1 mmol/L (ref 0.5–1.9)
Lactic Acid, Venous: 1.9 mmol/L (ref 0.5–1.9)

## 2017-12-07 LAB — COMPREHENSIVE METABOLIC PANEL WITH GFR
ALT: 14 U/L — ABNORMAL LOW (ref 17–63)
AST: 16 U/L (ref 15–41)
Albumin: 3.1 g/dL — ABNORMAL LOW (ref 3.5–5.0)
Alkaline Phosphatase: 55 U/L (ref 38–126)
Anion gap: 8 (ref 5–15)
BUN: 10 mg/dL (ref 6–20)
CO2: 24 mmol/L (ref 22–32)
Calcium: 8.6 mg/dL — ABNORMAL LOW (ref 8.9–10.3)
Chloride: 108 mmol/L (ref 101–111)
Creatinine, Ser: 0.93 mg/dL (ref 0.61–1.24)
GFR calc Af Amer: >60 mL/min
GFR calc non Af Amer: >60 mL/min
Glucose, Bld: 135 mg/dL — ABNORMAL HIGH (ref 65–99)
Potassium: 3.6 mmol/L (ref 3.5–5.1)
Sodium: 140 mmol/L (ref 135–145)
Total Bilirubin: 0.7 mg/dL (ref 0.3–1.2)
Total Protein: 5.8 g/dL — ABNORMAL LOW (ref 6.5–8.1)

## 2017-12-07 LAB — HIV ANTIBODY (ROUTINE TESTING W REFLEX): HIV Screen 4th Generation wRfx: NONREACTIVE

## 2017-12-07 LAB — SEDIMENTATION RATE: Sed Rate: 24 mm/hr — ABNORMAL HIGH (ref 0–16)

## 2017-12-07 LAB — GRAM STAIN

## 2017-12-07 LAB — PROCALCITONIN

## 2017-12-07 LAB — MRSA PCR SCREENING: MRSA BY PCR: NEGATIVE

## 2017-12-07 LAB — APTT: aPTT: 32 s (ref 24–36)

## 2017-12-07 LAB — PROTIME-INR
INR: 0.99
PROTHROMBIN TIME: 13 s (ref 11.4–15.2)

## 2017-12-07 SURGERY — ARTHROSCOPY, KNEE
Anesthesia: General | Laterality: Left

## 2017-12-07 MED ORDER — ACETAMINOPHEN 325 MG PO TABS: 650.0000 mg | ORAL_TABLET | Freq: Four times a day (QID) | ORAL | Status: DC | PRN

## 2017-12-07 MED ORDER — HYDROCODONE-ACETAMINOPHEN 5-325 MG PO TABS: 1.0000 | ORAL_TABLET | Freq: Four times a day (QID) | ORAL | 0 refills | Status: AC | PRN

## 2017-12-07 MED ORDER — ASPIRIN EC 81 MG PO TBEC: 81.0000 mg | DELAYED_RELEASE_TABLET | Freq: Every day | ORAL | 0 refills | Status: DC

## 2017-12-07 MED ORDER — GABAPENTIN 300 MG PO CAPS
300.0000 mg | ORAL_CAPSULE | Freq: Once | ORAL | Status: AC
  Administered 2017-12-07: 300 mg via ORAL

## 2017-12-07 MED ORDER — OXYCODONE HCL 5 MG PO TABS: 5.0000 mg | ORAL_TABLET | Freq: Once | ORAL | Status: DC | PRN

## 2017-12-07 MED ORDER — OXYCODONE HCL 5 MG PO TABS
5.0000 mg | ORAL_TABLET | ORAL | Status: DC | PRN
  Administered 2017-12-07: 10 mg via ORAL
  Filled 2017-12-07: qty 2

## 2017-12-07 MED ORDER — SODIUM CHLORIDE 0.9 % IV SOLN: INTRAVENOUS | Status: DC | PRN

## 2017-12-07 MED ORDER — ONDANSETRON HCL 4 MG PO TABS: 4.0000 mg | ORAL_TABLET | Freq: Four times a day (QID) | ORAL | Status: DC | PRN

## 2017-12-07 MED ORDER — DOCUSATE SODIUM 100 MG PO CAPS
100.0000 mg | ORAL_CAPSULE | Freq: Two times a day (BID) | ORAL | Status: DC
  Administered 2017-12-07 – 2017-12-08 (×2): 100 mg via ORAL
  Filled 2017-12-07 (×2): qty 1

## 2017-12-07 MED ORDER — LIDOCAINE HCL (PF) 1 % IJ SOLN
5.0000 mL | Freq: Once | INTRAMUSCULAR | Status: AC
  Administered 2017-12-07: 5 mL
  Filled 2017-12-07: qty 5

## 2017-12-07 MED ORDER — CEFAZOLIN SODIUM-DEXTROSE 2-4 GM/100ML-% IV SOLN
2.0000 g | INTRAVENOUS | Status: AC
  Administered 2017-12-07: 2 g via INTRAVENOUS

## 2017-12-07 MED ORDER — VANCOMYCIN HCL IN DEXTROSE 1-5 GM/200ML-% IV SOLN: 1000.0000 mg | Freq: Once | INTRAVENOUS | Status: DC

## 2017-12-07 MED ORDER — GABAPENTIN 300 MG PO CAPS
300.0000 mg | ORAL_CAPSULE | Freq: Three times a day (TID) | ORAL | Status: AC
  Administered 2017-12-07 – 2017-12-08 (×3): 300 mg via ORAL
  Filled 2017-12-07 (×3): qty 1

## 2017-12-07 MED ORDER — OXYCODONE HCL 5 MG/5ML PO SOLN: 5.0000 mg | Freq: Once | ORAL | Status: DC | PRN

## 2017-12-07 MED ORDER — ACETAMINOPHEN 500 MG PO TABS
ORAL_TABLET | ORAL | Status: AC
  Administered 2017-12-07: 1000 mg via ORAL
  Filled 2017-12-07: qty 2

## 2017-12-07 MED ORDER — ENOXAPARIN SODIUM 40 MG/0.4ML ~~LOC~~ SOLN
40.0000 mg | SUBCUTANEOUS | Status: DC
  Administered 2017-12-08: 40 mg via SUBCUTANEOUS
  Filled 2017-12-07: qty 0.4

## 2017-12-07 MED ORDER — BUPIVACAINE-EPINEPHRINE (PF) 0.5% -1:200000 IJ SOLN
INTRAMUSCULAR | Status: AC
  Filled 2017-12-07: qty 30

## 2017-12-07 MED ORDER — ACETAMINOPHEN 650 MG RE SUPP: 650.0000 mg | Freq: Four times a day (QID) | RECTAL | Status: DC | PRN

## 2017-12-07 MED ORDER — MIDAZOLAM HCL 5 MG/5ML IJ SOLN
INTRAMUSCULAR | Status: DC | PRN
  Administered 2017-12-07: 2 mg via INTRAVENOUS

## 2017-12-07 MED ORDER — MORPHINE SULFATE (PF) 4 MG/ML IV SOLN
2.0000 mg | INTRAVENOUS | Status: DC | PRN
  Administered 2017-12-07 (×4): 2 mg via INTRAVENOUS
  Filled 2017-12-07 (×4): qty 1

## 2017-12-07 MED ORDER — PROPOFOL 10 MG/ML IV BOLUS
INTRAVENOUS | Status: DC | PRN
  Administered 2017-12-07: 200 mg via INTRAVENOUS

## 2017-12-07 MED ORDER — GABAPENTIN 300 MG PO CAPS
ORAL_CAPSULE | ORAL | Status: AC
  Administered 2017-12-07: 300 mg via ORAL
  Filled 2017-12-07: qty 1

## 2017-12-07 MED ORDER — LACTATED RINGERS IV SOLN
INTRAVENOUS | Status: DC
  Administered 2017-12-07: 18:00:00 via INTRAVENOUS

## 2017-12-07 MED ORDER — BUPIVACAINE HCL (PF) 0.25 % IJ SOLN
INTRAMUSCULAR | Status: AC
  Filled 2017-12-07: qty 30

## 2017-12-07 MED ORDER — ACETAMINOPHEN 500 MG PO TABS
1000.0000 mg | ORAL_TABLET | Freq: Once | ORAL | Status: AC
  Administered 2017-12-07: 1000 mg via ORAL

## 2017-12-07 MED ORDER — BUPIVACAINE HCL 0.25 % IJ SOLN
INTRAMUSCULAR | Status: DC | PRN
  Administered 2017-12-07: 14 mL

## 2017-12-07 MED ORDER — PIPERACILLIN-TAZOBACTAM 3.375 G IVPB
3.3750 g | Freq: Three times a day (TID) | INTRAVENOUS | Status: DC
  Administered 2017-12-07 – 2017-12-08 (×4): 3.375 g via INTRAVENOUS
  Filled 2017-12-07 (×5): qty 50

## 2017-12-07 MED ORDER — GABAPENTIN 100 MG PO CAPS
ORAL_CAPSULE | ORAL | Status: AC
  Filled 2017-12-07: qty 2

## 2017-12-07 MED ORDER — MORPHINE SULFATE (PF) 4 MG/ML IV SOLN
4.0000 mg | Freq: Once | INTRAVENOUS | Status: AC
  Administered 2017-12-07 (×2): 4 mg via INTRAVENOUS
  Filled 2017-12-07: qty 1

## 2017-12-07 MED ORDER — CHLORHEXIDINE GLUCONATE 4 % EX LIQD
60.0000 mL | Freq: Once | CUTANEOUS | Status: AC
  Administered 2017-12-07: 4 via TOPICAL
  Filled 2017-12-07: qty 60

## 2017-12-07 MED ORDER — ONDANSETRON HCL 4 MG/2ML IJ SOLN
4.0000 mg | Freq: Four times a day (QID) | INTRAMUSCULAR | Status: DC | PRN
  Administered 2017-12-07 (×2): 4 mg via INTRAVENOUS
  Filled 2017-12-07: qty 2

## 2017-12-07 MED ORDER — PROMETHAZINE HCL 25 MG/ML IJ SOLN: 6.2500 mg | INTRAMUSCULAR | Status: DC | PRN

## 2017-12-07 MED ORDER — KETOROLAC TROMETHAMINE 15 MG/ML IJ SOLN
15.0000 mg | Freq: Four times a day (QID) | INTRAMUSCULAR | Status: DC
  Administered 2017-12-08 (×3): 15 mg via INTRAVENOUS
  Filled 2017-12-07 (×3): qty 1

## 2017-12-07 MED ORDER — FENTANYL CITRATE (PF) 100 MCG/2ML IJ SOLN
INTRAMUSCULAR | Status: DC | PRN
  Administered 2017-12-07: 50 ug via INTRAVENOUS
  Administered 2017-12-07: 100 ug via INTRAVENOUS
  Administered 2017-12-07 (×2): 50 ug via INTRAVENOUS

## 2017-12-07 MED ORDER — VANCOMYCIN HCL IN DEXTROSE 1-5 GM/200ML-% IV SOLN
1000.0000 mg | Freq: Three times a day (TID) | INTRAVENOUS | Status: DC
  Administered 2017-12-07 – 2017-12-08 (×3): 1000 mg via INTRAVENOUS
  Filled 2017-12-07 (×5): qty 200

## 2017-12-07 MED ORDER — ACETAMINOPHEN 325 MG PO TABS: 325.0000 mg | ORAL_TABLET | Freq: Four times a day (QID) | ORAL | Status: DC | PRN

## 2017-12-07 MED ORDER — CEFAZOLIN SODIUM-DEXTROSE 2-4 GM/100ML-% IV SOLN
INTRAVENOUS | Status: AC
  Filled 2017-12-07: qty 100

## 2017-12-07 MED ORDER — PIPERACILLIN-TAZOBACTAM 3.375 G IVPB 30 MIN: 3.3750 g | Freq: Once | INTRAVENOUS | Status: DC

## 2017-12-07 MED ORDER — ACETAMINOPHEN 500 MG PO TABS
1000.0000 mg | ORAL_TABLET | Freq: Three times a day (TID) | ORAL | Status: DC
  Administered 2017-12-07 – 2017-12-08 (×3): 1000 mg via ORAL
  Filled 2017-12-07 (×3): qty 2

## 2017-12-07 MED ORDER — LACTATED RINGERS IV SOLN
INTRAVENOUS | Status: DC
  Administered 2017-12-07 (×2): via INTRAVENOUS

## 2017-12-07 MED ORDER — SODIUM CHLORIDE 0.9 % IV SOLN
INTRAVENOUS | Status: AC
  Administered 2017-12-07: 100 mL/h via INTRAVENOUS
  Administered 2017-12-07 – 2017-12-08 (×2): via INTRAVENOUS

## 2017-12-07 MED ORDER — ONDANSETRON HCL 4 MG/2ML IJ SOLN: 4.0000 mg | Freq: Four times a day (QID) | INTRAMUSCULAR | Status: DC | PRN

## 2017-12-07 MED ORDER — SODIUM CHLORIDE 0.9 % IR SOLN
Status: DC | PRN
  Administered 2017-12-07 (×4): 3000 mL

## 2017-12-07 MED ORDER — HYDROMORPHONE HCL 1 MG/ML IJ SOLN
0.5000 mg | INTRAMUSCULAR | Status: DC | PRN
  Administered 2017-12-08 (×2): 0.5 mg via INTRAVENOUS
  Filled 2017-12-07 (×2): qty 0.5

## 2017-12-07 MED ORDER — MORPHINE SULFATE (PF) 4 MG/ML IV SOLN
4.0000 mg | INTRAVENOUS | Status: DC | PRN
  Administered 2017-12-07 (×2): 4 mg via INTRAVENOUS
  Filled 2017-12-07 (×2): qty 1

## 2017-12-07 MED ORDER — FENTANYL CITRATE (PF) 100 MCG/2ML IJ SOLN: 25.0000 ug | INTRAMUSCULAR | Status: DC | PRN

## 2017-12-07 MED ORDER — MORPHINE SULFATE (PF) 4 MG/ML IV SOLN
INTRAVENOUS | Status: AC
  Administered 2017-12-07: 4 mg via INTRAVENOUS
  Filled 2017-12-07: qty 1

## 2017-12-07 MED ORDER — LIDOCAINE 2% (20 MG/ML) 5 ML SYRINGE
INTRAMUSCULAR | Status: DC | PRN
  Administered 2017-12-07: 100 mg via INTRAVENOUS

## 2017-12-07 SURGICAL SUPPLY — 84 items
BANDAGE ACE 6X5 VEL STRL LF (GAUZE/BANDAGES/DRESSINGS) IMPLANT
BANDAGE ESMARK 6X9 LF (GAUZE/BANDAGES/DRESSINGS) IMPLANT
BLADE CLIPPER SURG (BLADE) IMPLANT
BLADE GREAT WHITE 4.2 (BLADE) ×1 IMPLANT
BLADE SURG 15 STRL LF DISP TIS (BLADE) IMPLANT
BLADE SURG 15 STRL SS (BLADE)
BNDG CMPR 9X6 STRL LF SNTH (GAUZE/BANDAGES/DRESSINGS) ×1
BNDG CMPR MED 10X6 ELC LF (GAUZE/BANDAGES/DRESSINGS) ×1
BNDG COHESIVE 4X5 TAN STRL (GAUZE/BANDAGES/DRESSINGS) ×2 IMPLANT
BNDG COHESIVE 6X5 TAN STRL LF (GAUZE/BANDAGES/DRESSINGS) ×1 IMPLANT
BNDG ELASTIC 6X10 VLCR STRL LF (GAUZE/BANDAGES/DRESSINGS) ×1 IMPLANT
BNDG ESMARK 6X9 LF (GAUZE/BANDAGES/DRESSINGS) ×2
BNDG GAUZE ELAST 4 BULKY (GAUZE/BANDAGES/DRESSINGS) ×3 IMPLANT
BUR 3.5 LG SPHERICAL (BURR) IMPLANT
BUR CUDA 2.9 (BURR) ×1 IMPLANT
BUR GATOR 2.9 (BURR) ×1 IMPLANT
BUR OVAL 4.0 (BURR) IMPLANT
BUR SPHERICAL 2.9 (BURR) ×1 IMPLANT
BURR 3.5 LG SPHERICAL (BURR)
CANNULA ACUFLEX KIT 5X76 (CANNULA) ×1 IMPLANT
COTTON STERILE ROLL (GAUZE/BANDAGES/DRESSINGS) IMPLANT
COVER SURGICAL LIGHT HANDLE (MISCELLANEOUS) ×2 IMPLANT
CUFF TOURNIQUET SINGLE 34IN LL (TOURNIQUET CUFF) IMPLANT
CUFF TOURNIQUET SINGLE 44IN (TOURNIQUET CUFF) IMPLANT
DECANTER SPIKE VIAL GLASS SM (MISCELLANEOUS) IMPLANT
DRAPE ARTHROSCOPY W/POUCH 114 (DRAPES) ×2 IMPLANT
DRAPE OEC MINIVIEW 54X84 (DRAPES) ×1 IMPLANT
DRAPE PROXIMA HALF (DRAPES) ×2 IMPLANT
DRAPE SURG 17X23 STRL (DRAPES) ×2 IMPLANT
DRAPE U-SHAPE 47X51 STRL (DRAPES) ×2 IMPLANT
DRSG ADAPTIC 3X8 NADH LF (GAUZE/BANDAGES/DRESSINGS) ×1 IMPLANT
DRSG PAD ABDOMINAL 8X10 ST (GAUZE/BANDAGES/DRESSINGS) IMPLANT
DURAPREP 26ML APPLICATOR (WOUND CARE) ×3 IMPLANT
ELECT REM PT RETURN 9FT ADLT (ELECTROSURGICAL) ×2
ELECTRODE REM PT RTRN 9FT ADLT (ELECTROSURGICAL) ×1 IMPLANT
FACESHIELD WRAPAROUND (MASK) ×2 IMPLANT
FACESHIELD WRAPAROUND OR TEAM (MASK) ×1 IMPLANT
GAUZE SPONGE 4X4 12PLY STRL (GAUZE/BANDAGES/DRESSINGS) ×3 IMPLANT
GAUZE XEROFORM 1X8 LF (GAUZE/BANDAGES/DRESSINGS) ×1 IMPLANT
GLOVE BIO SURGEON STRL SZ7.5 (GLOVE) ×4 IMPLANT
GLOVE BIOGEL PI IND STRL 8 (GLOVE) ×2 IMPLANT
GLOVE BIOGEL PI INDICATOR 8 (GLOVE) ×2
GOWN STRL REUS W/ TWL LRG LVL3 (GOWN DISPOSABLE) ×2 IMPLANT
GOWN STRL REUS W/ TWL XL LVL3 (GOWN DISPOSABLE) ×1 IMPLANT
GOWN STRL REUS W/TWL LRG LVL3 (GOWN DISPOSABLE) ×4
GOWN STRL REUS W/TWL XL LVL3 (GOWN DISPOSABLE) ×4
KIT BASIN OR (CUSTOM PROCEDURE TRAY) ×2 IMPLANT
KIT TURNOVER KIT B (KITS) ×2 IMPLANT
MANIFOLD NEPTUNE II (INSTRUMENTS) ×3 IMPLANT
NDL HYPO 25X1 1.5 SAFETY (NEEDLE) IMPLANT
NEEDLE HYPO 25X1 1.5 SAFETY (NEEDLE) ×2 IMPLANT
NS IRRIG 1000ML POUR BTL (IV SOLUTION) ×2 IMPLANT
PACK ARTHROSCOPY DSU (CUSTOM PROCEDURE TRAY) ×2 IMPLANT
PAD ABD 8X10 STRL (GAUZE/BANDAGES/DRESSINGS) ×2 IMPLANT
PAD ARMBOARD 7.5X6 YLW CONV (MISCELLANEOUS) ×4 IMPLANT
PADDING CAST ABS 4INX4YD NS (CAST SUPPLIES)
PADDING CAST ABS COTTON 4X4 ST (CAST SUPPLIES) ×1 IMPLANT
PADDING CAST COTTON 6X4 STRL (CAST SUPPLIES) ×1 IMPLANT
PENCIL BUTTON HOLSTER BLD 10FT (ELECTRODE) IMPLANT
SET ARTHROSCOPY TUBING (MISCELLANEOUS) ×2
SET ARTHROSCOPY TUBING LN (MISCELLANEOUS) ×1 IMPLANT
SET IRRIG Y TYPE TUR BLADDER L (SET/KITS/TRAYS/PACK) ×1 IMPLANT
SPONGE LAP 4X18 RFD (DISPOSABLE) ×2 IMPLANT
STAPLER VISISTAT 35W (STAPLE) IMPLANT
STOCKINETTE 6  STRL (DRAPES) ×1
STOCKINETTE 6 STRL (DRAPES) ×1 IMPLANT
STRAP ANKLE FOOT DISTRACTOR (ORTHOPEDIC SUPPLIES) ×2 IMPLANT
SUCTION FRAZIER HANDLE 10FR (MISCELLANEOUS)
SUCTION TUBE FRAZIER 10FR DISP (MISCELLANEOUS) IMPLANT
SUT ETHILON 2 0 FS 18 (SUTURE) IMPLANT
SUT ETHILON 4 0 PS 2 18 (SUTURE) ×1 IMPLANT
SUT MNCRL AB 4-0 PS2 18 (SUTURE) ×1 IMPLANT
SUT MON AB 2-0 CT1 27 (SUTURE) ×1 IMPLANT
SUT VIC AB 3-0 PS1 18 (SUTURE)
SUT VIC AB 3-0 PS1 18XBRD (SUTURE) IMPLANT
SYR BULB IRRIGATION 50ML (SYRINGE) IMPLANT
SYR CONTROL 10ML LL (SYRINGE) ×1 IMPLANT
TOWEL OR 17X24 6PK STRL BLUE (TOWEL DISPOSABLE) ×2 IMPLANT
TOWEL OR 17X26 10 PK STRL BLUE (TOWEL DISPOSABLE) ×2 IMPLANT
TOWEL OR NON WOVEN STRL DISP B (DISPOSABLE) ×2 IMPLANT
TUBE CONNECTING 12X1/4 (SUCTIONS) ×2 IMPLANT
UNDERPAD 30X30 (UNDERPADS AND DIAPERS) ×1 IMPLANT
WAND HAND CNTRL MULTIVAC 90 (MISCELLANEOUS) ×1 IMPLANT
WATER STERILE IRR 1000ML POUR (IV SOLUTION) ×2 IMPLANT

## 2017-12-07 NOTE — Op Note (Signed)
12/06/2017 - 12/07/2017  7:32 PM  PATIENT:  Michael Welch    PRE-OPERATIVE DIAGNOSIS:  infected left knee and ankle  POST-OPERATIVE DIAGNOSIS:  Same  PROCEDURE:  ARTHROSCOPIC KNEE IRRIGATION AND DEBRIDMENT, ANKLE ARTHROSCOPY IRRIGATION AND DEBRIDMENT  SURGEON:  Wende Longstreth, Jewel BaizeIMOTHY D, MD  ASSISTANT: Aquilla HackerHenry Martensen, PA-C, he was present and scrubbed throughout the case, critical for completion in a timely fashion, and for retraction, instrumentation, and closure.   ANESTHESIA:   gen  PREOPERATIVE INDICATIONS:  Michael Welch is a  22 y.o. male with a diagnosis of infected left knee and ankle who failed conservative measures and elected for surgical management.    The risks benefits and alternatives were discussed with the patient preoperatively including but not limited to the risks of infection, bleeding, nerve injury, cardiopulmonary complications, the need for revision surgery, among others, and the patient was willing to proceed.  OPERATIVE IMPLANTS: none  OPERATIVE FINDINGS: purulent fluid in the ankle and knee  BLOOD LOSS: min  COMPLICATIONS: none  TOURNIQUET TIME: none  OPERATIVE PROCEDURE:  Patient was identified in the preoperative holding area and site was marked by me He was transported to the operating theater and placed on the table in supine position taking care to pad all bony prominences. After a preincinduction time out anesthesia was induced. The left lower extremity was prepped and draped in normal sterile fashion and a pre-incision timeout was performed. He received ancef for preoperative antibiotics.   Created arthroscopic portal in the inferior patellar position medial and laterally at the knee.  I inserted the arthroscope and came to confirm that we are in the knee immediately expressed a large amount of purulent appearing fluid.  Next I touch the fluid to this and ran 6 L of fluid through the knee joint to thoroughly irrigate this infection.  Use the shaver to  debride any hypertrophic synovium.  Next I made 2 anterior portals one medial and lateral at the ankle using a nick and spread technique I inserted the arthroscope in the ankle and perform a thorough irrigation with 6 L of fluid here as well.  I debrided the hypertrophic synovium with the shaver.  I then removed all arthroscopic could be expressed all fluid I closed all portals with a nylon stitch injected Marcaine into both knee and ankle.  Sterile dressings were applied he was awoken and taken to PACU in stable condition  POST OPERATIVE PLAN: WBAT, abx and dvt px per primary team

## 2017-12-07 NOTE — Progress Notes (Signed)
Patient's Girlfriend Misty StanleyStacey told me that patient took a klonopin pill at 12 noon today.  This medication was given to patient by his mother.  It was her medication.  Girlfriend assumes patient only took one pill.

## 2017-12-07 NOTE — Anesthesia Postprocedure Evaluation (Signed)
Anesthesia Post Note  Patient: Glendora Scoreristan Dahlem  Procedure(s) Performed: ARTHROSCOPIC KNEE IRRIGATION AND DEBRIDMENT (Left ) ANKLE ARTHROSCOPY IRRIGATION AND DEBRIDMENT (Left )     Patient location during evaluation: PACU Anesthesia Type: General Level of consciousness: sedated Pain management: pain level controlled Vital Signs Assessment: post-procedure vital signs reviewed and stable Respiratory status: spontaneous breathing and respiratory function stable Cardiovascular status: stable Postop Assessment: no apparent nausea or vomiting Anesthetic complications: no    Last Vitals:  Vitals:   12/07/17 2115 12/07/17 2136  BP: 120/74 129/79  Pulse: 64 80  Resp: 19 18  Temp: 36.9 C 36.9 C  SpO2: 93% 99%    Last Pain:  Vitals:   12/07/17 2115  TempSrc:   PainSc: 0-No pain                 Rebeccah Ivins DANIEL

## 2017-12-07 NOTE — Progress Notes (Signed)
Pharmacy Antibiotic Note  Michael Welch is a 22 y.o. male admitted on 12/06/2017 with sepsis.  Pharmacy has been consulted for vancomycin and zosyn dosing.Vancomycin 1500 mg and zosyn 3.375 gm given earlier in the ED  Plan: Continue vancomycin 1gm IV q8 hours Cont zosyn EI F/u renal function, cultures and clinical course   Height: 6\' 1"  (185.4 cm) Weight: 170 lb (77.1 kg) IBW/kg (Calculated) : 79.9  Temp (24hrs), Avg:98.4 F (36.9 C), Min:98.3 F (36.8 C), Max:98.4 F (36.9 C)  Recent Labs  Lab 12/04/17 2234 12/06/17 2121 12/06/17 2210 12/06/17 2350  WBC 9.6 13.9*  --   --   CREATININE 0.83 0.92  --   --   LATICACIDVEN  --   --  2.81* 1.98*    Estimated Creatinine Clearance: 139.7 mL/min (by C-G formula based on SCr of 0.92 mg/dL).    Allergies  Allergen Reactions  . Sulfa Antibiotics Nausea And Vomiting and Swelling    Thank you for allowing pharmacy to be a part of this patient's care.  Talbert CageSeay, Solstice Lastinger Poteet 12/07/2017 4:56 AM

## 2017-12-07 NOTE — Anesthesia Procedure Notes (Signed)
Procedure Name: LMA Insertion Date/Time: 12/07/2017 7:00 PM Performed by: Sheppard EvensManess, Kj Imbert B, CRNA Pre-anesthesia Checklist: Patient identified, Emergency Drugs available, Suction available and Patient being monitored Patient Re-evaluated:Patient Re-evaluated prior to induction Oxygen Delivery Method: Circle System Utilized Preoxygenation: Pre-oxygenation with 100% oxygen Induction Type: IV induction Ventilation: Mask ventilation without difficulty LMA: LMA inserted LMA Size: 4.0 Number of attempts: 1 Airway Equipment and Method: Bite block Placement Confirmation: positive ETCO2 Tube secured with: Tape Dental Injury: Teeth and Oropharynx as per pre-operative assessment

## 2017-12-07 NOTE — Anesthesia Preprocedure Evaluation (Signed)
Anesthesia Evaluation  Patient identified by MRN, date of birth, ID band Patient awake    Reviewed: Allergy & Precautions, NPO status , Patient's Chart, lab work & pertinent test results  Airway Mallampati: II  TM Distance: >3 FB Neck ROM: Full    Dental  (+) Dental Advisory Given   Pulmonary Current Smoker,    breath sounds clear to auscultation       Cardiovascular negative cardio ROS   Rhythm:Regular Rate:Normal     Neuro/Psych negative neurological ROS  negative psych ROS   GI/Hepatic negative GI ROS, Neg liver ROS,   Endo/Other  negative endocrine ROS  Renal/GU negative Renal ROS  negative genitourinary   Musculoskeletal  (+) Arthritis ,   Abdominal   Peds  Hematology negative hematology ROS (+)   Anesthesia Other Findings   Reproductive/Obstetrics                             Anesthesia Physical Anesthesia Plan  ASA: I  Anesthesia Plan: General   Post-op Pain Management:    Induction: Intravenous  PONV Risk Score and Plan: 3 and Treatment may vary due to age or medical condition, Ondansetron, Midazolam and Diphenhydramine  Airway Management Planned: LMA  Additional Equipment: None  Intra-op Plan:   Post-operative Plan: Extubation in OR  Informed Consent: I have reviewed the patients History and Physical, chart, labs and discussed the procedure including the risks, benefits and alternatives for the proposed anesthesia with the patient or authorized representative who has indicated his/her understanding and acceptance.   Dental advisory given  Plan Discussed with: CRNA and Anesthesiologist  Anesthesia Plan Comments:         Anesthesia Quick Evaluation

## 2017-12-07 NOTE — Consult Note (Signed)
   ORTHOPAEDIC CONSULTATION  REQUESTING PHYSICIAN: Goodrich, Daniel P, MD  Chief Complaint: Left knee and ankle pain  HPI: Michael Welch is a 22 y.o. male who complains of left ankle pain since June 4 and left knee pain for the past few days.  He is also had subjective chills.  He did present to the ED previously and was prescribed doxycycline then to the ED again and was given prednisone.  His ankle swelling improved some with this but has had increasing knee pain and return of ankle pain in recent days.  He presented to the emergency department for evaluation.  His lactate level was found to be elevated with mild leukocytosis.  Left knee arthrocentesis was performed in the ED and blood cultures were obtained.  The patient reports arthrocentesis fluid to be green.  He denies chronic illness or use of illicit drugs.     Arthrocentesis cell count approximately 85,000 Today the patient reports significant knee and ankle pain.  Last meal yesterday.   He is unsure of his sulfa allergy.   History reviewed. No pertinent past medical history. Past Surgical History:  Procedure Laterality Date  . TYMPANOSTOMY     Social History   Socioeconomic History  . Marital status: Single    Spouse name: Not on file  . Number of children: Not on file  . Years of education: Not on file  . Highest education level: Not on file  Occupational History  . Not on file  Social Needs  . Financial resource strain: Not on file  . Food insecurity:    Worry: Not on file    Inability: Not on file  . Transportation needs:    Medical: Not on file    Non-medical: Not on file  Tobacco Use  . Smoking status: Current Every Day Smoker    Packs/day: 0.50  . Smokeless tobacco: Never Used  Substance and Sexual Activity  . Alcohol use: No  . Drug use: No  . Sexual activity: Yes  Lifestyle  . Physical activity:    Days per week: Not on file    Minutes per session: Not on file  . Stress: Not on file    Relationships  . Social connections:    Talks on phone: Not on file    Gets together: Not on file    Attends religious service: Not on file    Active member of club or organization: Not on file    Attends meetings of clubs or organizations: Not on file    Relationship status: Not on file  Other Topics Concern  . Not on file  Social History Narrative  . Not on file   Family History  Problem Relation Age of Onset  . Crohn's disease Neg Hx    Allergies  Allergen Reactions  . Sulfa Antibiotics Nausea And Vomiting and Swelling   Prior to Admission medications   Medication Sig Start Date End Date Taking? Authorizing Provider  clindamycin (CLEOCIN) 300 MG capsule Take 1 capsule (300 mg total) by mouth 4 (four) times daily. X 7 days 12/05/17  Yes Glick, David, MD  HYDROcodone-acetaminophen (NORCO) 5-325 MG tablet Take 1 tablet by mouth every 4 (four) hours as needed for moderate pain. 12/05/17  Yes Glick, David, MD  predniSONE (DELTASONE) 50 MG tablet Take 1 tablet (50 mg total) by mouth daily. 12/05/17  Yes Glick, David, MD  HYDROcodone-acetaminophen (NORCO/VICODIN) 5-325 MG tablet Take one tab po q 4 hrs prn pain Patient not taking:   Reported on 12/06/2017 12/05/17   Glick, David, MD   No results found.  Positive ROS: All other systems have been reviewed and were otherwise negative with the exception of those mentioned in the HPI and as above.  Objective: Labs cbc Recent Labs    12/06/17 2121 12/07/17 0553  WBC 13.9* 12.4*  HGB 14.8 12.6*  HCT 45.1 38.1*  PLT 331 266    Labs inflam No results for input(s): CRP in the last 72 hours.  Invalid input(s): ESR  Labs coag Recent Labs    12/07/17 0553  INR 0.99    Recent Labs    12/06/17 2121 12/07/17 0553  NA 140 140  K 4.0 3.6  CL 101 108  CO2 28 24  GLUCOSE 156* 135*  BUN 13 10  CREATININE 0.92 0.93  CALCIUM 10.1 8.6*    Physical Exam: Vitals:   12/07/17 0115 12/07/17 0349  BP: 125/76 126/71  Pulse: 71 64   Resp:  18  Temp:  98.4 F (36.9 C)  SpO2: 99% 99%   General: Alert.  Sitting at edge of bed with significant discomfort.  He is conversant and responds appropriately to questions. Mental status: Alert and Oriented x3 Neurologic: Speech Clear and organized, no gross focal findings or movement disorder appreciated. Respiratory: No cyanosis, no use of accessory musculature Cardiovascular: No pedal edema GI: Abdomen is soft and non-tender, non-distended. Skin: Warm and dry.   Extremities: Warm and well perfused w/o edema Psychiatric: Patient is competent for consent with normal mood and affect  MUSCULOSKELETAL:  Left knee and ankle with significant effusion.  Pain with range of motion of both.  Thigh and calf soft and nontender.  Foot is warm and sensation is intact distally. Other extremities are atraumatic with painless ROM and NVI.  Assessment / Plan: Principal Problem:   SIRS (systemic inflammatory response syndrome) (HCC) Active Problems:   Polyarthritis   Presumed left knee and ankle joint infection Continue empiric antibiotics. Plan for arthroscopic I&D/washout today.  The risks benefits and alternatives of the procedure were discussed with the patient including but not limited to infection, bleeding, nerve injury, the need for revision surgery, blood clots, cardiopulmonary complications, morbidity, mortality, among others.  The patient verbalizes understanding and wishes to proceed.     Contact information:  Timothy Murphy MD, Henry Martensen PA-C  Henry Calvin Martensen III PA-C 12/07/2017 8:05 AM 

## 2017-12-07 NOTE — Progress Notes (Signed)
Patient asked by NT Larene BeachVickie and Dot LanesKrista if [facility] but he refuses assistance.
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
## 2017-12-07 NOTE — H&P (View-Only) (Signed)
   ORTHOPAEDIC CONSULTATION  REQUESTING PHYSICIAN: Goodrich, Daniel P, MD  Chief Complaint: Left knee and ankle pain  HPI: Michael Welch is a 22 y.o. male who complains of left ankle pain since June 4 and left knee pain for the past few days.  He is also had subjective chills.  He did present to the ED previously and was prescribed doxycycline then to the ED again and was given prednisone.  His ankle swelling improved some with this but has had increasing knee pain and return of ankle pain in recent days.  He presented to the emergency department for evaluation.  His lactate level was found to be elevated with mild leukocytosis.  Left knee arthrocentesis was performed in the ED and blood cultures were obtained.  The patient reports arthrocentesis fluid to be green.  He denies chronic illness or use of illicit drugs.     Arthrocentesis cell count approximately 85,000 Today the patient reports significant knee and ankle pain.  Last meal yesterday.   He is unsure of his sulfa allergy.   History reviewed. No pertinent past medical history. Past Surgical History:  Procedure Laterality Date  . TYMPANOSTOMY     Social History   Socioeconomic History  . Marital status: Single    Spouse name: Not on file  . Number of children: Not on file  . Years of education: Not on file  . Highest education level: Not on file  Occupational History  . Not on file  Social Needs  . Financial resource strain: Not on file  . Food insecurity:    Worry: Not on file    Inability: Not on file  . Transportation needs:    Medical: Not on file    Non-medical: Not on file  Tobacco Use  . Smoking status: Current Every Day Smoker    Packs/day: 0.50  . Smokeless tobacco: Never Used  Substance and Sexual Activity  . Alcohol use: No  . Drug use: No  . Sexual activity: Yes  Lifestyle  . Physical activity:    Days per week: Not on file    Minutes per session: Not on file  . Stress: Not on file    Relationships  . Social connections:    Talks on phone: Not on file    Gets together: Not on file    Attends religious service: Not on file    Active member of club or organization: Not on file    Attends meetings of clubs or organizations: Not on file    Relationship status: Not on file  Other Topics Concern  . Not on file  Social History Narrative  . Not on file   Family History  Problem Relation Age of Onset  . Crohn's disease Neg Hx    Allergies  Allergen Reactions  . Sulfa Antibiotics Nausea And Vomiting and Swelling   Prior to Admission medications   Medication Sig Start Date End Date Taking? Authorizing Provider  clindamycin (CLEOCIN) 300 MG capsule Take 1 capsule (300 mg total) by mouth 4 (four) times daily. X 7 days 12/05/17  Yes Glick, David, MD  HYDROcodone-acetaminophen (NORCO) 5-325 MG tablet Take 1 tablet by mouth every 4 (four) hours as needed for moderate pain. 12/05/17  Yes Glick, David, MD  predniSONE (DELTASONE) 50 MG tablet Take 1 tablet (50 mg total) by mouth daily. 12/05/17  Yes Glick, David, MD  HYDROcodone-acetaminophen (NORCO/VICODIN) 5-325 MG tablet Take one tab po q 4 hrs prn pain Patient not taking:   Reported on 1/61/0960 4/54/09   Delora Fuel, MD   No results found.  Positive ROS: All other systems have been reviewed and were otherwise negative with the exception of those mentioned in the HPI and as above.  Objective: Labs cbc Recent Labs    12/06/17 2121 12/07/17 0553  WBC 13.9* 12.4*  HGB 14.8 12.6*  HCT 45.1 38.1*  PLT 331 266    Labs inflam No results for input(s): CRP in the last 72 hours.  Invalid input(s): ESR  Labs coag Recent Labs    12/07/17 0553  INR 0.99    Recent Labs    12/06/17 2121 12/07/17 0553  NA 140 140  K 4.0 3.6  CL 101 108  CO2 28 24  GLUCOSE 156* 135*  BUN 13 10  CREATININE 0.92 0.93  CALCIUM 10.1 8.6*    Physical Exam: Vitals:   12/07/17 0115 12/07/17 0349  BP: 125/76 126/71  Pulse: 71 64   Resp:  18  Temp:  98.4 F (36.9 C)  SpO2: 99% 99%   General: Alert.  Sitting at edge of bed with significant discomfort.  He is conversant and responds appropriately to questions. Mental status: Alert and Oriented x3 Neurologic: Speech Clear and organized, no gross focal findings or movement disorder appreciated. Respiratory: No cyanosis, no use of accessory musculature Cardiovascular: No pedal edema GI: Abdomen is soft and non-tender, non-distended. Skin: Warm and dry.   Extremities: Warm and well perfused w/o edema Psychiatric: Patient is competent for consent with normal mood and affect  MUSCULOSKELETAL:  Left knee and ankle with significant effusion.  Pain with range of motion of both.  Thigh and calf soft and nontender.  Foot is warm and sensation is intact distally. Other extremities are atraumatic with painless ROM and NVI.  Assessment / Plan: Principal Problem:   SIRS (systemic inflammatory response syndrome) (HCC) Active Problems:   Polyarthritis   Presumed left knee and ankle joint infection Continue empiric antibiotics. Plan for arthroscopic I&D/washout today.  The risks benefits and alternatives of the procedure were discussed with the patient including but not limited to infection, bleeding, nerve injury, the need for revision surgery, blood clots, cardiopulmonary complications, morbidity, mortality, among others.  The patient verbalizes understanding and wishes to proceed.     Contact information:  Edmonia Lynch MD, Roxan Hockey PA-C  Prudencio Burly III PA-C 12/07/2017 8:05 AM

## 2017-12-07 NOTE — Interval H&P Note (Signed)
History and Physical Interval Note:  12/07/2017 1:07 PM  Michael Welch  has presented today for surgery, with the diagnosis of infected left knee and ankle  The various methods of treatment have been discussed with the patient and family. After consideration of risks, benefits and other options for treatment, the patient has consented to  Procedure(s): ARTHROSCOPIC KNEE IRRIGATION AND DEBRIDMENT (Left) ANKLE ARTHROSCOPY IRRIGATION AND DEBRIDMENT (Left) as a surgical intervention .  The patient's history has been reviewed, patient examined, no change in status, stable for surgery.  I have reviewed the patient's chart and labs.  Questions were answered to the patient's satisfaction.     Hoa Deriso D

## 2017-12-07 NOTE — Transfer of Care (Signed)
Immediate Anesthesia Transfer of Care Note  Patient: Michael Welch  Procedure(s) Performed: ARTHROSCOPIC KNEE IRRIGATION AND DEBRIDMENT (Left ) ANKLE ARTHROSCOPY IRRIGATION AND DEBRIDMENT (Left )  Patient Location: PACU  Anesthesia Type:General  Level of Consciousness: responds to stimulation  Airway & Oxygen Therapy: Patient Spontanous Breathing and oral airway in situ  Post-op Assessment: Report given to RN and Post -op Vital signs reviewed and stable  Post vital signs: Reviewed and stable  Last Vitals:  Vitals Value Taken Time  BP 118/76 12/07/2017  7:59 PM  Temp    Pulse 62 12/07/2017  8:12 PM  Resp 17 12/07/2017  8:12 PM  SpO2 95 % 12/07/2017  8:12 PM  Vitals shown include unvalidated device data.  Last Pain:  Vitals:   12/07/17 1945  TempSrc:   PainSc: Asleep      Patients Stated Pain Goal: 0 (12/07/17 0400)  Complications: No apparent anesthesia complications

## 2017-12-07 NOTE — H&P (Signed)
History and Physical    Michael Scoreristan Smyre [id number] DOB: June 02, 1996 DOA: 12/06/2017  PCP: Estanislado PandySasser, Paul W, MD  Patient coming from: Home.  Chief Complaint: Left knee pain and swelling.  HPI: Michael Welch is a 22 y.o. male with no significant past medical history has been experiencing left foot swelling and left ankle swelling 13 days ago after he visited the beach.  Patient had gone to the ER and was initially prescribed doxycycline.  3 days following which he received to the ER again and was given prednisone in addition to antibiotics.  After he took his prednisone his left ankle swelling improved but still persisted but now his left knee started worsening.  Patient also noticed some blood in the stools recently.  Denies any abdominal pain nausea vomiting chest pain shortness of breath productive cough.  His left lower extremity pain has been progressing approximately.  Has been a subjective feeling of fever chills.  ED Course: In the ER patient's lactate level is found to be elevated with mild leukocytosis.  Patient had left knee arthrocentesis done.  Blood cultures were obtained and started on empiric antibiotics and admitted for further management.  Review of Systems: As per HPI, rest all negative.   History reviewed. No pertinent past medical history.  Past Surgical History:  Procedure Laterality Date  . TYMPANOSTOMY       reports that he has been smoking.  He has been smoking about 0.50 packs per day. He has never used smokeless tobacco. He reports that he does not drink alcohol or use drugs.  Allergies  Allergen Reactions  . Sulfa Antibiotics Nausea And Vomiting and Swelling    Family History  Problem Relation Age of Onset  . Crohn's disease Neg Hx     Prior to Admission medications   Medication Sig Start Date End Date Taking? Authorizing Provider  clindamycin (CLEOCIN) 300 MG capsule Take 1 capsule (300 mg total) by mouth 4 (four) times daily. X 7 days 12/05/17  Yes  Dione BoozeGlick, David, MD  HYDROcodone-acetaminophen [facility](NORCO) 5-325 MG tablet Take 1 tablet by mouth every 4 (four) hours as needed for moderate pain. 12/05/17  Yes Dione BoozeGlick, David, MD  predniSONE (DELTASONE) 50 MG tablet Take 1 tablet (50 mg total) by mouth daily. 12/05/17  Yes Dione BoozeGlick, David, MD  HYDROcodone-acetaminophen (NORCO/VICODIN) 5-325 MG tablet Take one tab po q 4 hrs prn pain Patient not taking: Reported on 12/06/2017 12/05/17   Dione BoozeGlick, David, MD    Physical Exam: Vitals:   12/07/17 0045 12/07/17 0100 12/07/17 0115 12/07/17 0349  BP: 107/69 121/74 125/76 126/71  Pulse: 70 65 71 64  Resp: 14 17  18   Temp:    98.4 F (36.9 C)  TempSrc:    Oral  SpO2: 98% 96% 99% 99%  Weight:      Height:          Constitutional: Moderately built and nourished. Vitals:   12/07/17 0045 12/07/17 0100 12/07/17 0115 12/07/17 0349  BP: 107/69 121/74 125/76 126/71  Pulse: 70 65 71 64  Resp: 14 17  18   Temp:    98.4 F (36.9 C)  TempSrc:    Oral  SpO2: 98% 96% 99% 99%  Weight:      Height:       Eyes: Anicteric no pallor. ENMT: No discharge from the ears eyes nose or mouth. Neck: No mass felt.  No neck rigidity. Respiratory: No rhonchi or crepitations. Cardiovascular: S1-S2 heard no murmurs appreciated. Abdomen: Soft nontender bowel sounds  present. Musculoskeletal: Swelling of the left foot left ankle left calf left knee extending to the thigh. Skin: Mild erythema of the left lower extremity. Neurologic: Alert awake oriented to time place and person.  Moves all extremities. Psychiatric: Appears normal.  Normal affect.   Labs on Admission: I have personally reviewed following labs and imaging studies  CBC: Recent Labs  Lab 12/04/17 2234 12/06/17 2121  WBC 9.6 13.9*  NEUTROABS 6.3 12.3*  HGB 14.8 14.8  HCT 43.0 45.1  MCV 90.5 90.7  PLT 268 331   Basic Metabolic Panel: Recent Labs  Lab 12/04/17 2234 12/06/17 2121  NA 139 140  K 3.7 4.0  CL 99* 101  CO2 30 28  GLUCOSE 87 156*  BUN 9  13  CREATININE 0.83 0.92  CALCIUM 9.7 10.1   GFR: Estimated Creatinine Clearance: 139.7 mL/min (by C-G formula based on SCr of 0.92 mg/dL). Liver Function Tests: Recent Labs  Lab 12/06/17 2121  AST 19  ALT 17  ALKPHOS 63  BILITOT 0.9  PROT 8.0  ALBUMIN 4.1   No results for input(s): LIPASE, AMYLASE in the last 168 hours. No results for input(s): AMMONIA in the last 168 hours. Coagulation Profile: No results for input(s): INR, PROTIME in the last 168 hours. Cardiac Enzymes: No results for input(s): CKTOTAL, CKMB, CKMBINDEX, TROPONINI in the last 168 hours. BNP (last 3 results) No results for input(s): PROBNP in the last 8760 hours. HbA1C: No results for input(s): HGBA1C in the last 72 hours. CBG: No results for input(s): GLUCAP in the last 168 hours. Lipid Profile: No results for input(s): CHOL, HDL, LDLCALC, TRIG, CHOLHDL, LDLDIRECT in the last 72 hours. Thyroid Function Tests: No results for input(s): TSH, T4TOTAL, FREET4, T3FREE, THYROIDAB in the last 72 hours. Anemia Panel: No results for input(s): VITAMINB12, FOLATE, FERRITIN, TIBC, IRON, RETICCTPCT in the last 72 hours. Urine analysis:    Component Value Date/Time   COLORURINE YELLOW 12/07/2017 0320   APPEARANCEUR CLEAR 12/07/2017 0320   LABSPEC 1.013 12/07/2017 0320   PHURINE 6.0 12/07/2017 0320   GLUCOSEU NEGATIVE 12/07/2017 0320   HGBUR MODERATE (A) 12/07/2017 0320   BILIRUBINUR NEGATIVE 12/07/2017 0320   KETONESUR NEGATIVE 12/07/2017 0320   PROTEINUR NEGATIVE 12/07/2017 0320   UROBILINOGEN 0.2 10/14/2010 1238   NITRITE NEGATIVE 12/07/2017 0320   LEUKOCYTESUR NEGATIVE 12/07/2017 0320   Sepsis Labs: @LABRCNTIP (procalcitonin:4,lacticidven:4) ) Recent Results (from the past 240 hour(s))  Gram stain     Status: None   Collection Time: 12/07/17 12:49 AM  Result Value Ref Range Status   Specimen Description JOINT FLUID LEFT KNEE  Final   Special Requests NONE  Final   Gram Stain   Final    MODERATE WBC  PRESENT, PREDOMINANTLY PMN NO ORGANISMS SEEN Performed at [facility], [address], [place], [place] [address]    Report Status 12/07/2017 FINAL  Final     Radiological Exams on Admission: No results found.   Assessment/Plan Principal Problem:   SIRS (systemic inflammatory response syndrome) (HCC) Active Problems:   Polyarthritis    1. SIRS -with the left ankle and left knee swelling.  Arthrocentesis labs show WBC count is around 85,000 with Gram stain negative.  Blood cultures and cultures of the arthrocentesis lab has been sent.  Patient started on empiric antibiotics.  I have consulted orthopedic surgeon Dr. Eulah Pont will be seeing patient in consult.  Patient will be kept n.p.o. in anticipation of possible procedure.  Continue with pain relief medications.  Arthrocentesis  lab does not show any crystals. 2. Hyperglycemia likely from recent use of steroids. 3. Blood in the stool -follow CBC.  Will eventually need colonoscopy.   DVT prophylaxis: SCDs. Code Status: Full code. Family Communication: Discussed with patient. Disposition Plan: Home. Consults called: Orthopedics. Admission status: Inpatient.   Eduard Clos MD [facility] Pager [phone].  If 7PM-7AM, please contact night-coverage [email or web address] Password [facility]  12/07/2017, 4:41 AM

## 2017-12-08 ENCOUNTER — Encounter (HOSPITAL_COMMUNITY): Payer: Self-pay | Admitting: Orthopedic Surgery

## 2017-12-08 DIAGNOSIS — M25462 Effusion, left knee: Secondary | ICD-10-CM

## 2017-12-08 DIAGNOSIS — Z5321 Procedure and treatment not carried out due to patient leaving prior to being seen by health care provider: Secondary | ICD-10-CM

## 2017-12-08 DIAGNOSIS — M0089 Polyarthritis due to other bacteria: Secondary | ICD-10-CM

## 2017-12-08 DIAGNOSIS — A549 Gonococcal infection, unspecified: Secondary | ICD-10-CM | POA: Clinically undetermined

## 2017-12-08 DIAGNOSIS — M009 Pyogenic arthritis, unspecified: Principal | ICD-10-CM

## 2017-12-08 DIAGNOSIS — R6510 Systemic inflammatory response syndrome (SIRS) of non-infectious origin without acute organ dysfunction: Secondary | ICD-10-CM

## 2017-12-08 DIAGNOSIS — Z87438 Personal history of other diseases of male genital organs: Secondary | ICD-10-CM

## 2017-12-08 DIAGNOSIS — M25472 Effusion, left ankle: Secondary | ICD-10-CM

## 2017-12-08 DIAGNOSIS — F17210 Nicotine dependence, cigarettes, uncomplicated: Secondary | ICD-10-CM

## 2017-12-08 DIAGNOSIS — M25562 Pain in left knee: Secondary | ICD-10-CM

## 2017-12-08 DIAGNOSIS — M130 Polyarthritis, unspecified: Secondary | ICD-10-CM

## 2017-12-08 DIAGNOSIS — Z882 Allergy status to sulfonamides status: Secondary | ICD-10-CM

## 2017-12-08 DIAGNOSIS — R7989 Other specified abnormal findings of blood chemistry: Secondary | ICD-10-CM

## 2017-12-08 DIAGNOSIS — B9689 Other specified bacterial agents as the cause of diseases classified elsewhere: Secondary | ICD-10-CM

## 2017-12-08 LAB — BASIC METABOLIC PANEL
Anion gap: 4 — ABNORMAL LOW (ref 5–15)
BUN: <5 mg/dL — ABNORMAL LOW (ref 6–20)
CALCIUM: 8.9 mg/dL (ref 8.9–10.3)
CO2: 32 mmol/L (ref 22–32)
CREATININE: 0.9 mg/dL (ref 0.61–1.24)
Chloride: 104 mmol/L (ref 101–111)
GFR calc non Af Amer: >60 mL/min (ref >60)
Glucose, Bld: 93 mg/dL (ref 65–99)
Potassium: 4.4 mmol/L (ref 3.5–5.1)
Sodium: 140 mmol/L (ref 135–145)

## 2017-12-08 LAB — PROTEIN, BODY FLUID (OTHER): Total Protein, Body Fluid Other: 4 g/dL

## 2017-12-08 LAB — GLUCOSE, BODY FLUID OTHER: Glucose, Body Fluid Other: 5 mg/dL

## 2017-12-08 LAB — GC/CHLAMYDIA PROBE AMP (~~LOC~~) NOT AT ARMC
CHLAMYDIA, DNA PROBE: NEGATIVE
Neisseria Gonorrhea: NEGATIVE

## 2017-12-08 MED ORDER — SODIUM CHLORIDE 0.9 % IV SOLN
2.0000 g | INTRAVENOUS | Status: DC
  Administered 2017-12-08: 2 g via INTRAVENOUS
  Filled 2017-12-08: qty 20

## 2017-12-08 MED ORDER — ASPIRIN EC 81 MG PO TBEC: 81.0000 mg | DELAYED_RELEASE_TABLET | Freq: Every day | ORAL | 0 refills | Status: AC

## 2017-12-08 MED ORDER — DOXYCYCLINE HYCLATE 100 MG PO CAPS: 100.0000 mg | ORAL_CAPSULE | Freq: Two times a day (BID) | ORAL | 0 refills | Status: DC

## 2017-12-08 NOTE — Discharge Summary (Signed)
Physician Discharge Summary  Anders Hohmann YQI:[id number] DOB: 03/31/1996 DOA: 12/06/2017  PCP: Estanislado Pandy, MD  Admit date: 12/06/2017 Discharge date: 12/08/2017  Admitted From: Home Discharge disposition: Home   Recommendations for Outpatient Follow-Up:   1. Recommend close F/U with ID and orthopedics. Patient refused further treatment in the hospital. Needs close follow up.   Discharge Diagnosis:   Principal Problem:   Septic arthritis (HCC) Active Problems:   SIRS (systemic inflammatory response syndrome) (HCC)   Polyarthritis   Effusion of left knee   Left ankle effusion   Gonorrhea  Discharge Condition: Improved.  Diet recommendation:  Regular.  Wound care: Leave dressings intact until follow up with orthopedics.  Code status: Full.   History of Present Illness:   Michael Welch is an 22 y.o. male who was admitted 12/06/2017 for evaluation of a 2-week history of left ankle, knee and lower leg swelling associated with worsening pain.  No improvement status post treatment with oral clindamycin, but did have some improvement with prednisone.  Hospital Course by Problem:   Principal Problem:   Septic arthritis (HCC)/SIRS/polyarthritis/effusion of left knee and ankle Status post joint aspiration, negative Gram stain.  Underwent arthroscopic knee irrigation and debridement, ankle arthroscopy irrigation and debridement by orthopedics 12/07/2017.  Blood cultures and synovial fluid cultures pending.  Pain improved postoperatively.  Initially treated with Zosyn and vancomycin--->Rocephin/Vanc per ID recs.  GC/chlamydia pending. The patient insisted on discharge and threatened to leave AMA. Discussed best option to treat with oral antibiotics with ID, who recommended d/c on doxycycline 100 mg BID x 1 month with close F/U to cover gonoccocus and staph. The patient fully understood the risk of leaving against medical advice, which includes worsening infection, sepsis, death. He  agreed to close follow up, but was concerned that he would lose his job/car/insurance if he stayed in the hospital.  Active Problems:   Gonorrhea S/P outpatient treatment. F/U ID.   Medical Consultants:    ID   Discharge Exam:   Vitals:   12/08/17 0609 12/08/17 1321  BP: (!) 107/51 112/65  Pulse: 71 75  Resp: 18 18  Temp: 98.2 F (36.8 C) 98.8 F (37.1 C)  SpO2: 98% 100%   Vitals:   12/07/17 2115 12/07/17 2136 12/08/17 0609 12/08/17 1321  BP: 120/74 129/79 (!) 107/51 112/65  Pulse: 64 80 71 75  Resp: 19 18 18 18   Temp: 98.4 F (36.9 C) 98.5 F (36.9 C) 98.2 F (36.8 C) 98.8 F (37.1 C)  TempSrc:   Oral Oral  SpO2: 93% 99% 98% 100%  Weight:      Height:        General exam: Appears calm and comfortable.  Respiratory system: Clear to auscultation. Respiratory effort normal. Cardiovascular system: S1 & S2 heard, RRR. No JVD,  rubs, gallops or clicks. No murmurs. Gastrointestinal system: Abdomen is nondistended, soft and nontender. No organomegaly or masses felt. Normal bowel sounds heard. Central nervous system: Alert and oriented. No focal neurological deficits. Extremities: No clubbing,  or cyanosis. No edema. LLE wrapped. Dressings CDI. Skin: No rashes, lesions or ulcers. Psychiatry: Judgement and insight appear poor. Mood & affect irritable.    The results of significant diagnostics from this hospitalization (including imaging, microbiology, ancillary and laboratory) are listed below for reference.     Procedures and Diagnostic Studies:   Dg Ankle Complete Left  Result Date: 12/07/2017 CLINICAL DATA:  Ankle pain for several days EXAM: LEFT ANKLE COMPLETE - 3+ VIEW COMPARISON:  11/30/2017  FINDINGS: Mild soft tissue swelling is noted although slightly improved when compare with the prior exam. Previously seen joint effusion has reduced as well. No acute bony abnormality is noted. IMPRESSION: Reduction in soft tissue swelling and joint effusion when compared  with the prior exam. Electronically Signed   By: Alcide CleverMark  Lukens M.D.   On: 12/07/2017 09:37   Dg Knee 3 Views Left  Result Date: 12/07/2017 CLINICAL DATA:  Left knee pain for several days, no known injury, initial encounter EXAM: LEFT KNEE - 2 VIEW COMPARISON:  None. FINDINGS: No acute fracture or dislocation is noted. Joint effusion is seen. No other focal abnormality is noted. IMPRESSION: Joint effusion, no acute fracture. Electronically Signed   By: Alcide CleverMark  Lukens M.D.   On: 12/07/2017 09:37     Labs:   Basic Metabolic Panel: Recent Labs  Lab 12/04/17 2234 12/06/17 2121 12/07/17 0553 12/08/17 0605  NA 139 140 140 140  K 3.7 4.0 3.6 4.4  CL 99* 101 108 104  CO2 30 28 24  32  GLUCOSE 87 156* 135* 93  BUN 9 13 10  <5*  CREATININE 0.83 0.92 0.93 0.90  CALCIUM 9.7 10.1 8.6* 8.9   GFR Estimated Creatinine Clearance: 142.8 mL/min (by C-G formula based on SCr of 0.9 mg/dL). Liver Function Tests: Recent Labs  Lab 12/06/17 2121 12/07/17 0553  AST 19 16  ALT 17 14*  ALKPHOS 63 55  BILITOT 0.9 0.7  PROT 8.0 5.8*  ALBUMIN 4.1 3.1*   Coagulation profile Recent Labs  Lab 12/07/17 0553  INR 0.99    CBC: Recent Labs  Lab 12/04/17 2234 12/06/17 2121 12/07/17 0553  WBC 9.6 13.9* 12.4*  NEUTROABS 6.3 12.3* 8.4*  HGB 14.8 14.8 12.6*  HCT 43.0 45.1 38.1*  MCV 90.5 90.7 90.9  PLT 268 331 266   Microbiology Recent Results (from the past 240 hour(s))  Blood Culture (routine x 2)     Status: None (Preliminary result)   Collection Time: 12/06/17 11:41 PM  Result Value Ref Range Status   Specimen Description BLOOD RIGHT ANTECUBITAL  Final   Special Requests   Final    BOTTLES DRAWN AEROBIC AND ANAEROBIC Blood Culture results may not be optimal due to an inadequate volume of blood received in culture bottles Performed at [facility], [address] [place], [place] [address]    Culture NO GROWTH 1 DAY  Final   Report Status PENDING  Incomplete  Blood Culture (routine x  2)     Status: None (Preliminary result)   Collection Time: 12/07/17 12:02 AM  Result Value Ref Range Status   Specimen Description BLOOD LEFT ANTECUBITAL  Final   Special Requests   Final    BOTTLES DRAWN AEROBIC AND ANAEROBIC Blood Culture adequate volume Performed at [facility], [address] [place], [place] [address]    Culture NO GROWTH 1 DAY  Final   Report Status PENDING  Incomplete  Culture, body fluid-bottle     Status: None (Preliminary result)   Collection Time: 12/07/17 12:49 AM  Result Value Ref Range Status   Specimen Description JOINT FLUID LEFT KNEE  Final   Special Requests   Final    NONE Performed at [facility], [address] [place], [place] [address]    Culture NO GROWTH 1 DAY  Final   Report Status PENDING  Incomplete  Gram stain     Status: None   Collection Time: 12/07/17 12:49 AM  Result  Value Ref Range Status   Specimen Description JOINT FLUID LEFT KNEE  Final   Special Requests NONE  Final   Gram Stain   Final    MODERATE WBC PRESENT, PREDOMINANTLY PMN NO ORGANISMS SEEN Performed at [facility], [address], [place], [place] [address]    Report Status 12/07/2017 FINAL  Final  MRSA PCR Screening     Status: None   Collection Time: 12/07/17 10:00 AM  Result Value Ref Range Status   MRSA by PCR NEGATIVE NEGATIVE Final    Comment:        The GeneXpert MRSA Assay (FDA approved for NASAL specimens only), is one component of a comprehensive MRSA colonization surveillance program. It is not intended to diagnose MRSA infection nor to guide or monitor treatment for MRSA infections. Performed at [facility], [address], [place], [place] [address]      Discharge Instructions:   Discharge Instructions    Call MD for:  extreme fatigue   Complete by:  As directed    Call MD for:  redness, tenderness, or signs of infection (pain, swelling, redness, odor or green/yellow discharge around incision site)    Complete by:  As directed    Call MD for:  severe uncontrolled pain   Complete by:  As directed    Call MD for:  temperature >100.4   Complete by:  As directed    Diet - low sodium heart healthy   Complete by:  As directed    Increase activity slowly   Complete by:  As directed    Other Restrictions   Complete by:  As directed    Activity as tolerated. Avoid heavy lifting, prolonged standing. Employer, please excuse from work through 12/08/17.     Allergies as of 12/08/2017      Reactions   Sulfa Antibiotics Nausea And Vomiting, Swelling      Medication List    STOP taking these medications   clindamycin 300 MG capsule Commonly known as:  CLEOCIN   predniSONE 50 MG tablet Commonly known as:  DELTASONE     TAKE these medications   aspirin EC 81 MG tablet Take 1 tablet (81 mg total) by mouth daily. For DVT Prophylaxis.   doxycycline 100 MG capsule Commonly known as:  VIBRAMYCIN Take 1 capsule (100 mg total) by mouth 2 (two) times daily.   HYDROcodone-acetaminophen 5-325 MG tablet Commonly known as:  NORCO Take 1-2 tablets by mouth every 6 (six) hours as needed for up to 7 days for moderate pain. What changed:    how much to take  how to take this  when to take this  reasons to take this  additional instructions  Another medication with the same name was removed. Continue taking this medication, and follow the directions you see here.      Follow-up Information    Sheral Apley, MD Follow up in 2 week(s).   Specialty:  Orthopedic Surgery Contact information: [address] [place] [address] [phone]        [facility] for Infectious Disease. Call on 12/13/2017.   Specialty:  Infectious Diseases Why:  Please call for follow up with [facility] in the next 1-2 weeks.  Contact information: [address] [place], [address] [id number] [place] [address] [phone]           Time  coordinating discharge: 35 minutes including coordinating care with ID to determine best  alternative to standard of care due to patient unwillingness to stay in the hospital.  Signed:  Hillery Aldo  Pager [phone] [facility] 12/08/2017, 3:05 PM

## 2017-12-08 NOTE — Progress Notes (Signed)
2300: While receiving handoff report from RN, NT approached and notified me that pt smoking in room; not sure if she witnessed this first hand or smelled it. Security called and asked to counsel the pt and search his room per [facility] Policy. Pt allowed his room to be searched, but denied smoking. Pt's girlfriend gave two packs of cigarettes to security to be held at the desk until she leaves, at which point they will be returned to her.  0000: Pt reports great improvement in pain after surgery. While medicating for pain, pt asked me about "little white round pills" for pain (PRN oxycodone IR in [facility]). Pt under the impression that surgeon told him to ask for the pills every two hours. I educated the pt on proper reporting of pain, informed him that the RN is responsible for assessing the pt and appropriately carrying out orders. I reinforced to the pt that he should regularly report pain, especially as it increases, but that he can not pick and choose which pain medications he receives.   [id number]: Pt called out stating, "I was supposed to get pain medicine at 0305." When I went to assess the pt for a pain score, he was sleeping. No pain medication administered.  0400: Pt continues resting w/o s/s of distress/discomfort.  0700: Handoff report given to RN. No acute events overnight.

## 2017-12-08 NOTE — Progress Notes (Signed)
    Subjective: Patient reports pain as mild, controlled, significantly improved postoperatively.  Patient hoping to discharge to home soon as possible.  Objective:   VITALS:   Vitals:   12/07/17 2045 12/07/17 2115 12/07/17 2136 12/08/17 0609  BP:  120/74 129/79 (!) 107/51  Pulse: 71 64 80 71  Resp: 13 19 18 18   Temp:  98.4 F (36.9 C) 98.5 F (36.9 C) 98.2 F (36.8 C)  TempSrc:    Oral  SpO2: 98% 93% 99% 98%  Weight:      Height:       CBC Latest Ref Rng & Units 12/07/2017 12/06/2017 12/04/2017  WBC 4.0 - 10.5 K/uL 12.4(H) 13.9(H) 9.6  Hemoglobin 13.0 - 17.0 g/dL 12.6(L) 14.8 14.8  Hematocrit 39.0 - 52.0 % 38.1(L) 45.1 43.0  Platelets 150 - 400 K/uL 266 331 268   BMP Latest Ref Rng & Units 12/08/2017 12/07/2017 12/06/2017  Glucose 65 - 99 mg/dL 93 454(U135(H) 981(X156(H)  BUN 6 - 20 mg/dL <9(J<5(L) 10 13  Creatinine 0.61 - 1.24 mg/dL 4.780.90 2.950.93 6.210.92  Sodium 135 - 145 mmol/L 140 140 140  Potassium 3.5 - 5.1 mmol/L 4.4 3.6 4.0  Chloride 101 - 111 mmol/L 104 108 101  CO2 22 - 32 mmol/L 32 24 28  Calcium 8.9 - 10.3 mg/dL 8.9 3.0(Q8.6(L) 65.710.1   Intake/Output      06/18 0701 - 06/19 0700 06/19 0701 - 06/20 0700   P.O. 0    I.V. (mL/kg) 2798.9 (36.3)    IV Piggyback 582.1    Total Intake(mL/kg) 3380.9 (43.9)    Urine (mL/kg/hr) 1325 (0.7)    Blood 10    Total Output 1335    Net +2045.9            Physical Exam: General: NAD.  Asleep in bed on arrival. MSK LLE: Dressings clean and intact. Neurovascularly intact Sensation intact distally feet warm Feet warm Dorsiflexion/Plantar flexion, EHL/FHL intact Good early range of motion at knee and ankle without apparent discomfort   Assessment: 1 Day Post-Op  S/P Procedure(s) (LRB): ARTHROSCOPIC KNEE IRRIGATION AND DEBRIDMENT (Left) ANKLE ARTHROSCOPY IRRIGATION AND DEBRIDMENT (Left) by Dr. Jewel Baizeimothy D. Eulah PontMurphy on 12/07/2017  Principal Problem:   SIRS (systemic inflammatory response syndrome) (HCC) Active Problems:   Polyarthritis  Effusion of left knee   Left ankle effusion   Septic arthritis (HCC)   Left knee and ankle joint effusion with presumed infection  Status post arthroscopic irrigation and debridement Doing well postop day 1. Eating, drinking, and voiding. Pain controlled. Patient hoping to discharge home soon. Cultures still in process.  Plan: Continue empiric antibiotic therapy Weightbearing: WBAT LLE Insicional and dressing care: Leave dressings on until follow up. Orthopedic device(s): None Showering: Keep dressing dry VTE prophylaxis: Lovenox 40mg  qd while inpatient. SCDs, ambulation.  ASA 81 mg daily for 30 days after discharge Follow - up plan: Follow up in the office with Dr. Wandra Feinstein. Murphy in 1-2 weeks.  Please call with questions.  Contact information:  Margarita Ranaimothy Murphy MD, Aquilla HackerHenry Martensen PA-C    Lucretia KernHenry Calvin Martensen III, PA-C 12/08/2017, 8:26 AM

## 2017-12-08 NOTE — Consult Note (Addendum)
[facility]    Date of Admission:  12/06/2017   Total days of antibiotics 3        Day 3 vancomycin                Reason for Consult: Septic arthritis, polyarticular     Referring Provider: Eulah Pont  Primary Care Provider: Estanislado Pandy, MD   Assessment: Michael Welch is a 22 y.o. male with spontaneous polyarticular septic arthritis of the left knee and left ankle. He has a very recent history of gonorrhea infection (treated with "shot and pills"). So far intra-op cultures and knee aspirate show no organisms on gram stain and no growth. Gonococcal arthritis can be culture negative in up to 50% of cases; however he also was receiving antibiotics for over a week prior to procedure. Would obtain swab of the oropharynx and urine to check for gonorrhea. Change zosyn to ceftriaxone. Continue vancomycin for now. We discussed PICC line for home parenteral therapy - he is OK with this and plans to return to work as soon as he leaves. I asked him to please give [place] another 1-2 days to coordinate he has what he needs to be successfully treated with best chance for cure and no relapse.   HIV screen negative during this admission.   Plan: 1. Collect urine sample for gonorrhea NAAT test.  2. Obtain throat swab for gonorrhea NAAT test as well.   3. Stop zosyn - start ceftriaxone 4. Continue vancomycin    ADDENDUM: 3:02 PM Dr. Darnelle Catalan called to update that [name] has decided to leave AMA today by 5:00pm. There is no way to arrange a PICC or midline to be placed in this amount of time. Would recommend 4 week course of doxycycline 100 mg BID with food to treat if he is unwilling to stay. It is still not certain that this is related to gonococcal disease or not and there is a high chance for his relapse (which we discussed during original encounter). I will be happy to see him in ID clinic in 1-2 weeks to ensure he is not getting any worse to where we would need to arrange for  outpatient PICC line.   Principal Problem:   SIRS (systemic inflammatory response syndrome) (HCC) Active Problems:   Polyarthritis   Effusion of left knee   Left ankle effusion   Septic arthritis (HCC)  . acetaminophen  1,000 mg Oral Q8H  . docusate sodium  100 mg Oral BID  . enoxaparin (LOVENOX) injection  40 mg Subcutaneous Q24H  . gabapentin  300 mg Oral TID  . ketorolac  15 mg Intravenous Q6H    HPI: Michael Welch is a 22 y.o. male with no significant past medical history admitted on 12/06/2017 for left knee and foot/ankle pain and swelling. This problem started 14 days ago after he returned home from beach vacation. He presented to the emergency room and was prescribed cephalexin. Later was given course of prednisone due to increased pain and doxycycline. He later returned again for third evaluation of the left knee and ankle pain. Imaging was obtained reflecting joint effusion of the knee. Arthrocentesis was performed revealing 85k WBC (85% neuts). Arthroscopic I&D of the knee was performed - large amount of purulent fluid was immediately expressed. Left ankle was debrided as well and irrigated. No further operative cultures were sent.   In discussion with Iori he tells me he has never used any illicit  drugs or prescription pills. No injection drug use of any kind. 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 No new sexual partners but he tells me that he and his girlfriend were both treated for gonorrhea about a month ago with "pills and a shot." He does not presently describe urethral discharge or dysuria.   He would really like to leave so he can return to work. Planning on sitting on a chair to work. Has not walked much yet.   Review of Systems: Review of Systems  Constitutional: Positive for chills, diaphoresis and malaise/fatigue. Negative for fever and weight loss.  HENT: Negative for sore throat.   Respiratory: Negative for cough and sputum production.   Cardiovascular: Positive  for leg swelling. Negative for chest pain.  Gastrointestinal: Negative for abdominal pain, diarrhea and vomiting.  Genitourinary: Negative for dysuria and flank pain.  Musculoskeletal: Positive for joint pain. Negative for myalgias and neck pain.  Skin: Negative for rash.  Neurological: Negative for dizziness, tingling and headaches.  Psychiatric/Behavioral: Negative for depression and substance abuse. The patient is not nervous/anxious and does not have insomnia.     Past Medical History:  Diagnosis Date  . MRSA (methicillin resistant Staphylococcus aureus)     Social History   Tobacco Use  . Smoking status: Current Every Day Smoker    Packs/day: 0.50  . Smokeless tobacco: Never Used  Substance Use Topics  . Alcohol use: No  . Drug use: No    Family History  Problem Relation Age of Onset  . Healthy Mother   . Healthy Father   . Crohn's disease Neg Hx    Allergies  Allergen Reactions  . Sulfa Antibiotics Nausea And Vomiting and Swelling    OBJECTIVE: Blood pressure (!) 107/51, pulse 71, temperature 98.2 F (36.8 C), temperature source Oral, resp. rate 18, height 6' 0.99" (1.854 m), weight 170 lb (77.1 kg), SpO2 98 %.  Physical Exam  Constitutional: He is oriented to person, place, and time. He appears well-developed and well-nourished.  Resting in bed.   HENT:  Mouth/Throat: Mucous membranes are normal. Normal dentition. No dental abscesses. No oropharyngeal exudate.  Cardiovascular: Normal rate, regular rhythm and normal heart sounds.  Pulmonary/Chest: Effort normal and breath sounds normal.  Abdominal: Soft. He exhibits no distension. There is no tenderness.  Musculoskeletal: He exhibits edema (left foot/knee ) and tenderness (left foot/knee ).  Left leg wrapped in ACE wrap from foot to knee.   Lymphadenopathy:    He has no cervical adenopathy.  Neurological: He is alert and oriented to person, place, and time.  Skin: Skin is warm and dry. No rash noted.    Psychiatric: He has a normal mood and affect. Judgment normal.  Vitals reviewed.   Lab Results Lab Results  Component Value Date   WBC 12.4 (H) 12/07/2017   HGB 12.6 (L) 12/07/2017   HCT 38.1 (L) 12/07/2017   MCV 90.9 12/07/2017   PLT 266 12/07/2017    Lab Results  Component Value Date   CREATININE 0.90 12/08/2017   BUN <5 (L) 12/08/2017   NA 140 12/08/2017   K 4.4 12/08/2017   CL 104 12/08/2017   CO2 32 12/08/2017    Lab Results  Component Value Date   ALT 14 (L) 12/07/2017   AST 16 12/07/2017   ALKPHOS 55 12/07/2017   BILITOT 0.7 12/07/2017     Microbiology: Recent Results (from the past 240 hour(s))  Gram stain     Status: None  Collection Time: 12/07/17 12:49 AM  Result Value Ref Range Status   Specimen Description JOINT FLUID LEFT KNEE  Final   Special Requests NONE  Final   Gram Stain   Final    MODERATE WBC PRESENT, PREDOMINANTLY PMN NO ORGANISMS SEEN Performed at [facility], [address] [place], [place] [address]    Report Status 12/07/2017 FINAL  Final  MRSA PCR Screening     Status: None   Collection Time: 12/07/17 10:00 AM  Result Value Ref Range Status   MRSA by PCR NEGATIVE NEGATIVE Final    Comment:        The GeneXpert MRSA Assay (FDA approved for NASAL specimens only), is one component of a comprehensive MRSA colonization surveillance program. It is not intended to diagnose MRSA infection nor to guide or monitor treatment for MRSA infections. Performed at [facility], [address] [place], [place] [address]     Rexene AlbertsStephanie Dixon, MSN, NP-C [facility] Cell: [phone] Pager: [phone]  12/08/2017 12:50 PM

## 2017-12-08 NOTE — Progress Notes (Signed)
Patient discharge teaching given, including activity, diet, follow-up appoints, and medications. Patient verbalized understanding of all discharge instructions. IV access was d/c'd. Vitals are stable. Skin is intact except as charted in most recent assessments. Pt to be escorted out by NT, to be driven home by family. 

## 2017-12-09 DIAGNOSIS — Z8619 Personal history of other infectious and parasitic diseases: Secondary | ICD-10-CM

## 2017-12-09 LAB — GC/CHLAMYDIA PROBE AMP (~~LOC~~) NOT AT ARMC
CHLAMYDIA, DNA PROBE: NEGATIVE
Neisseria Gonorrhea: NEGATIVE

## 2017-12-12 LAB — CULTURE, BLOOD (ROUTINE X 2)
CULTURE: NO GROWTH
Culture: NO GROWTH
SPECIAL REQUESTS: ADEQUATE

## 2017-12-12 LAB — CULTURE, BODY FLUID-BOTTLE: CULTURE: NO GROWTH

## 2017-12-12 LAB — CULTURE, BODY FLUID W GRAM STAIN -BOTTLE

## 2017-12-13 ENCOUNTER — Other Ambulatory Visit: Payer: Self-pay | Admitting: *Deleted

## 2017-12-13 NOTE — Patient Outreach (Signed)
[facility]) Care Management  12/13/2017  Michael Welch 05/27/1996 [id number]   Subjective: Telephone call to patient's home number, spoke with patient, and HIPAA verified.  Discussed [name] Care [facility] [employer] Transition of care follow up, patient voiced understanding, and is in agreement to follow up.    Patient states he is doing a little bit better, has been having headaches off and on, headache relieved by Excedrin, RNCM advised patient to report headaches to surgeon, and / or primary MD, patient voiced understanding, states he will follow up after completion of this call with MD.  States he has a follow up appointment with surgeon on 12/29/17, will call infectious disease MD, and primary MD today to schedule hospital follow up appointment.   He is also planning to ask infectious disease for update on culture results. Patient states he is able to manage self care and has assistance as needed with activities of daily living / home management.   Patient voices understanding of medical diagnosis, surgery, and treatment plan.  States he is accessing her [employer] benefits as needed via member services number on back of card or through [email or web address].  Patient states he does not have any education material, transition of care, care coordination, disease management, disease monitoring, transportation, community resource, or pharmacy needs at this time.  States he is very appreciative of the follow up and is in agreement to receive [facility] information.        Objective: Per KPN (Knowledge Performance Now, point of care tool), [employer] iCollaborate, and chart review, patient hospitalized  12/06/17 -12/08/17 for Septic arthritis, Polyarthritis, Effusion of left knee, Left ankle effusion, and status post ARTHROSCOPIC KNEE IRRIGATION AND DEBRIDMENT, ANKLE ARTHROSCOPY IRRIGATION AND DEBRIDMENT on 12/07/17.    Patient also has a history of Gonorrhea.          Assessment: Received [employer]  Transition of care referral on 12/13/17.    Transition of care follow up completed, no care management needs, and will proceed with case closure.       Plan: RNCM will send patient successful outreach letter, [facility] pamphlet, and magnet. RNCM will complete case closure due to follow up completed / no care management needs.         Michael Muska H. Gardiner Barefootooper RN, BSN, CCM [name] Care Management [name] Telephonic CM Phone: [phone] Fax: [phone]

## 2017-12-16 ENCOUNTER — Inpatient Hospital Stay (HOSPITAL_COMMUNITY)
Admission: EM | Admit: 2017-12-16 | Discharge: 2017-12-22 | DRG: 549 | Disposition: A | Discharge status: Home Health | Payer: Managed Care, Other (non HMO) | Attending: Family Medicine | Admitting: Family Medicine

## 2017-12-16 ENCOUNTER — Encounter (HOSPITAL_COMMUNITY): Payer: Self-pay | Admitting: Emergency Medicine

## 2017-12-16 ENCOUNTER — Other Ambulatory Visit: Payer: Self-pay

## 2017-12-16 DIAGNOSIS — Z882 Allergy status to sulfonamides status: Secondary | ICD-10-CM

## 2017-12-16 DIAGNOSIS — R509 Fever, unspecified: Secondary | ICD-10-CM | POA: Diagnosis present

## 2017-12-16 DIAGNOSIS — M79671 Pain in right foot: Secondary | ICD-10-CM | POA: Diagnosis not present

## 2017-12-16 DIAGNOSIS — M009 Pyogenic arthritis, unspecified: Secondary | ICD-10-CM | POA: Diagnosis not present

## 2017-12-16 DIAGNOSIS — R6510 Systemic inflammatory response syndrome (SIRS) of non-infectious origin without acute organ dysfunction: Secondary | ICD-10-CM | POA: Diagnosis present

## 2017-12-16 DIAGNOSIS — Z8619 Personal history of other infectious and parasitic diseases: Secondary | ICD-10-CM

## 2017-12-16 DIAGNOSIS — F17200 Nicotine dependence, unspecified, uncomplicated: Secondary | ICD-10-CM | POA: Diagnosis present

## 2017-12-16 DIAGNOSIS — R233 Spontaneous ecchymoses: Secondary | ICD-10-CM | POA: Diagnosis present

## 2017-12-16 DIAGNOSIS — M545 Low back pain: Secondary | ICD-10-CM | POA: Diagnosis present

## 2017-12-16 DIAGNOSIS — Z8614 Personal history of Methicillin resistant Staphylococcus aureus infection: Secondary | ICD-10-CM

## 2017-12-16 DIAGNOSIS — Z720 Tobacco use: Secondary | ICD-10-CM | POA: Diagnosis present

## 2017-12-16 DIAGNOSIS — M25562 Pain in left knee: Secondary | ICD-10-CM | POA: Diagnosis present

## 2017-12-16 DIAGNOSIS — F1210 Cannabis abuse, uncomplicated: Secondary | ICD-10-CM | POA: Diagnosis present

## 2017-12-16 DIAGNOSIS — M25572 Pain in left ankle and joints of left foot: Secondary | ICD-10-CM | POA: Diagnosis present

## 2017-12-16 DIAGNOSIS — Z91018 Allergy to other foods: Secondary | ICD-10-CM

## 2017-12-16 DIAGNOSIS — Z7982 Long term (current) use of aspirin: Secondary | ICD-10-CM

## 2017-12-16 DIAGNOSIS — M25552 Pain in left hip: Secondary | ICD-10-CM | POA: Diagnosis present

## 2017-12-16 DIAGNOSIS — Z881 Allergy status to other antibiotic agents status: Secondary | ICD-10-CM

## 2017-12-16 DIAGNOSIS — Z79899 Other long term (current) drug therapy: Secondary | ICD-10-CM

## 2017-12-16 DIAGNOSIS — M79676 Pain in unspecified toe(s): Secondary | ICD-10-CM

## 2017-12-16 DIAGNOSIS — M79674 Pain in right toe(s): Secondary | ICD-10-CM | POA: Diagnosis not present

## 2017-12-16 DIAGNOSIS — M130 Polyarthritis, unspecified: Secondary | ICD-10-CM | POA: Diagnosis present

## 2017-12-16 NOTE — ED Triage Notes (Signed)
Pt reports left leg surgery on 6/18 in which he got infection removed. Pt went to a hospital in [place] earlier today d/t worsening pain but decided to leave and come here where surgery was done. Pt reports surgeon wanted pt to stay longer in the hospital but pt wasn't aware of that when he got discharged earlier than planned. Left hip and knee pain 9/10. PMS intact, left foot swelling noted. Per other hospital MD, stitches healing well.

## 2017-12-17 ENCOUNTER — Other Ambulatory Visit: Payer: Self-pay

## 2017-12-17 ENCOUNTER — Emergency Department (HOSPITAL_COMMUNITY): Payer: Managed Care, Other (non HMO)

## 2017-12-17 ENCOUNTER — Inpatient Hospital Stay (HOSPITAL_COMMUNITY): Payer: Managed Care, Other (non HMO)

## 2017-12-17 DIAGNOSIS — M545 Low back pain: Secondary | ICD-10-CM | POA: Diagnosis present

## 2017-12-17 DIAGNOSIS — R509 Fever, unspecified: Secondary | ICD-10-CM | POA: Diagnosis present

## 2017-12-17 DIAGNOSIS — F17210 Nicotine dependence, cigarettes, uncomplicated: Secondary | ICD-10-CM | POA: Diagnosis not present

## 2017-12-17 DIAGNOSIS — M25552 Pain in left hip: Secondary | ICD-10-CM | POA: Diagnosis present

## 2017-12-17 DIAGNOSIS — Z882 Allergy status to sulfonamides status: Secondary | ICD-10-CM | POA: Diagnosis not present

## 2017-12-17 DIAGNOSIS — Z8614 Personal history of Methicillin resistant Staphylococcus aureus infection: Secondary | ICD-10-CM | POA: Diagnosis not present

## 2017-12-17 DIAGNOSIS — M79674 Pain in right toe(s): Secondary | ICD-10-CM | POA: Diagnosis not present

## 2017-12-17 DIAGNOSIS — M79671 Pain in right foot: Secondary | ICD-10-CM | POA: Diagnosis not present

## 2017-12-17 DIAGNOSIS — F1210 Cannabis abuse, uncomplicated: Secondary | ICD-10-CM | POA: Diagnosis present

## 2017-12-17 DIAGNOSIS — F17200 Nicotine dependence, unspecified, uncomplicated: Secondary | ICD-10-CM | POA: Diagnosis present

## 2017-12-17 DIAGNOSIS — M009 Pyogenic arthritis, unspecified: Secondary | ICD-10-CM | POA: Diagnosis present

## 2017-12-17 DIAGNOSIS — R233 Spontaneous ecchymoses: Secondary | ICD-10-CM | POA: Diagnosis present

## 2017-12-17 DIAGNOSIS — M01X9 Direct infection of multiple joints in infectious and parasitic diseases classified elsewhere: Secondary | ICD-10-CM | POA: Diagnosis not present

## 2017-12-17 DIAGNOSIS — Z8619 Personal history of other infectious and parasitic diseases: Secondary | ICD-10-CM | POA: Diagnosis not present

## 2017-12-17 DIAGNOSIS — M25562 Pain in left knee: Secondary | ICD-10-CM | POA: Diagnosis present

## 2017-12-17 DIAGNOSIS — M25572 Pain in left ankle and joints of left foot: Secondary | ICD-10-CM | POA: Diagnosis present

## 2017-12-17 DIAGNOSIS — Z7982 Long term (current) use of aspirin: Secondary | ICD-10-CM | POA: Diagnosis not present

## 2017-12-17 DIAGNOSIS — M00862 Arthritis due to other bacteria, left knee: Secondary | ICD-10-CM | POA: Diagnosis not present

## 2017-12-17 DIAGNOSIS — A498 Other bacterial infections of unspecified site: Secondary | ICD-10-CM | POA: Diagnosis not present

## 2017-12-17 DIAGNOSIS — Z79899 Other long term (current) drug therapy: Secondary | ICD-10-CM | POA: Diagnosis not present

## 2017-12-17 DIAGNOSIS — Z888 Allergy status to other drugs, medicaments and biological substances status: Secondary | ICD-10-CM | POA: Diagnosis not present

## 2017-12-17 DIAGNOSIS — Z720 Tobacco use: Secondary | ICD-10-CM | POA: Diagnosis not present

## 2017-12-17 DIAGNOSIS — Z91018 Allergy to other foods: Secondary | ICD-10-CM | POA: Diagnosis not present

## 2017-12-17 DIAGNOSIS — M00069 Staphylococcal arthritis, unspecified knee: Secondary | ICD-10-CM | POA: Diagnosis not present

## 2017-12-17 DIAGNOSIS — M130 Polyarthritis, unspecified: Secondary | ICD-10-CM | POA: Diagnosis not present

## 2017-12-17 DIAGNOSIS — M01X0 Direct infection of unspecified joint in infectious and parasitic diseases classified elsewhere: Secondary | ICD-10-CM | POA: Diagnosis not present

## 2017-12-17 DIAGNOSIS — Z881 Allergy status to other antibiotic agents status: Secondary | ICD-10-CM | POA: Diagnosis not present

## 2017-12-17 DIAGNOSIS — R6510 Systemic inflammatory response syndrome (SIRS) of non-infectious origin without acute organ dysfunction: Secondary | ICD-10-CM | POA: Diagnosis not present

## 2017-12-17 LAB — RAPID URINE DRUG SCREEN, HOSP PERFORMED
AMPHETAMINES: NOT DETECTED
Benzodiazepines: NOT DETECTED
COCAINE: NOT DETECTED
OPIATES: POSITIVE — AB
TETRAHYDROCANNABINOL: POSITIVE — AB

## 2017-12-17 LAB — COMPREHENSIVE METABOLIC PANEL
ALBUMIN: 3.1 g/dL — AB (ref 3.5–5.0)
ALK PHOS: 60 U/L (ref 38–126)
ALT: 21 U/L (ref 0–44)
ANION GAP: 10 (ref 5–15)
AST: 19 U/L (ref 15–41)
BUN: 8 mg/dL (ref 6–20)
CALCIUM: 9.3 mg/dL (ref 8.9–10.3)
CO2: 24 mmol/L (ref 22–32)
Chloride: 102 mmol/L (ref 98–111)
Creatinine, Ser: 0.77 mg/dL (ref 0.61–1.24)
GFR calc Af Amer: >60 mL/min (ref >60)
GFR calc non Af Amer: >60 mL/min (ref >60)
GLUCOSE: 112 mg/dL — AB (ref 70–99)
POTASSIUM: 4 mmol/L (ref 3.5–5.1)
SODIUM: 136 mmol/L (ref 135–145)
Total Bilirubin: 0.7 mg/dL (ref 0.3–1.2)
Total Protein: 7.2 g/dL (ref 6.5–8.1)

## 2017-12-17 LAB — CBC WITH DIFFERENTIAL/PLATELET
Abs Immature Granulocytes: 0.1 10*3/uL (ref 0.0–0.1)
Basophils Absolute: 0 10*3/uL (ref 0.0–0.1)
Basophils Relative: 0 %
EOS PCT: 1 %
Eosinophils Absolute: 0.1 10*3/uL (ref 0.0–0.7)
HCT: 38.9 % — ABNORMAL LOW (ref 39.0–52.0)
HEMOGLOBIN: 13 g/dL (ref 13.0–17.0)
IMMATURE GRANULOCYTES: 1 %
LYMPHS ABS: 2 10*3/uL (ref 0.7–4.0)
LYMPHS PCT: 18 %
MCH: 30 pg (ref 26.0–34.0)
MCHC: 33.4 g/dL (ref 30.0–36.0)
MCV: 89.6 fL (ref 78.0–100.0)
Monocytes Absolute: 1.3 10*3/uL — ABNORMAL HIGH (ref 0.1–1.0)
Monocytes Relative: 12 %
NEUTROS ABS: 7.3 10*3/uL (ref 1.7–7.7)
NEUTROS PCT: 68 %
Platelets: 476 10*3/uL — ABNORMAL HIGH (ref 150–400)
RBC: 4.34 MIL/uL (ref 4.22–5.81)
RDW: 11.9 % (ref 11.5–15.5)
WBC: 10.8 10*3/uL — AB (ref 4.0–10.5)

## 2017-12-17 LAB — URINALYSIS, ROUTINE W REFLEX MICROSCOPIC
BACTERIA UA: NONE SEEN
Bilirubin Urine: NEGATIVE
Glucose, UA: NEGATIVE mg/dL
KETONES UR: NEGATIVE mg/dL
Leukocytes, UA: NEGATIVE
Nitrite: NEGATIVE
PH: 6 (ref 5.0–8.0)
Protein, ur: NEGATIVE mg/dL
Specific Gravity, Urine: 1.012 (ref 1.005–1.030)

## 2017-12-17 LAB — I-STAT CG4 LACTIC ACID, ED: LACTIC ACID, VENOUS: 0.69 mmol/L (ref 0.5–1.9)

## 2017-12-17 MED ORDER — NICOTINE 14 MG/24HR TD PT24
14.0000 mg | MEDICATED_PATCH | Freq: Every day | TRANSDERMAL | Status: DC
  Administered 2017-12-17 – 2017-12-22 (×6): 14 mg via TRANSDERMAL
  Filled 2017-12-17 (×6): qty 1

## 2017-12-17 MED ORDER — MORPHINE SULFATE (PF) 4 MG/ML IV SOLN
4.0000 mg | INTRAVENOUS | Status: DC | PRN
  Administered 2017-12-17 – 2017-12-21 (×22): 4 mg via INTRAVENOUS
  Filled 2017-12-17 (×23): qty 1

## 2017-12-17 MED ORDER — VANCOMYCIN HCL 10 G IV SOLR
1500.0000 mg | Freq: Once | INTRAVENOUS | Status: AC
  Administered 2017-12-17: 1500 mg via INTRAVENOUS
  Filled 2017-12-17: qty 1500

## 2017-12-17 MED ORDER — VANCOMYCIN HCL IN DEXTROSE 1-5 GM/200ML-% IV SOLN
1000.0000 mg | Freq: Three times a day (TID) | INTRAVENOUS | Status: DC
  Administered 2017-12-17 – 2017-12-18 (×4): 1000 mg via INTRAVENOUS
  Filled 2017-12-17 (×4): qty 200

## 2017-12-17 MED ORDER — MORPHINE SULFATE (PF) 4 MG/ML IV SOLN
4.0000 mg | Freq: Once | INTRAVENOUS | Status: AC
  Administered 2017-12-17: 4 mg via INTRAVENOUS
  Filled 2017-12-17: qty 1

## 2017-12-17 MED ORDER — SODIUM CHLORIDE 0.9 % IV BOLUS (SEPSIS)
1000.0000 mL | Freq: Once | INTRAVENOUS | Status: AC
  Administered 2017-12-17: 1000 mL via INTRAVENOUS

## 2017-12-17 MED ORDER — HYDROCODONE-ACETAMINOPHEN 5-325 MG PO TABS
1.0000 | ORAL_TABLET | ORAL | Status: DC | PRN
  Administered 2017-12-17 – 2017-12-21 (×20): 2 via ORAL
  Filled 2017-12-17 (×20): qty 2

## 2017-12-17 MED ORDER — BISACODYL 10 MG RE SUPP: 10.0000 mg | Freq: Every day | RECTAL | Status: DC | PRN

## 2017-12-17 MED ORDER — ONDANSETRON HCL 4 MG/2ML IJ SOLN
4.0000 mg | Freq: Once | INTRAMUSCULAR | Status: AC
  Administered 2017-12-17: 4 mg via INTRAVENOUS
  Filled 2017-12-17: qty 2

## 2017-12-17 MED ORDER — SODIUM CHLORIDE 0.9 % IV SOLN: INTRAVENOUS | Status: DC

## 2017-12-17 MED ORDER — SODIUM CHLORIDE 0.9 % IV SOLN
1000.0000 mL | INTRAVENOUS | Status: DC
  Administered 2017-12-17 – 2017-12-18 (×4): 1000 mL via INTRAVENOUS

## 2017-12-17 MED ORDER — GADOBENATE DIMEGLUMINE 529 MG/ML IV SOLN
15.0000 mL | Freq: Once | INTRAVENOUS | Status: AC
  Administered 2017-12-17: 15 mL via INTRAVENOUS

## 2017-12-17 MED ORDER — ACETAMINOPHEN 650 MG RE SUPP: 650.0000 mg | Freq: Four times a day (QID) | RECTAL | Status: DC | PRN

## 2017-12-17 MED ORDER — SODIUM CHLORIDE 0.9 % IV SOLN
1.0000 g | Freq: Once | INTRAVENOUS | Status: AC
  Administered 2017-12-17: 1 g via INTRAVENOUS
  Filled 2017-12-17: qty 10

## 2017-12-17 MED ORDER — SENNOSIDES-DOCUSATE SODIUM 8.6-50 MG PO TABS
1.0000 | ORAL_TABLET | Freq: Every evening | ORAL | Status: DC | PRN
  Filled 2017-12-17: qty 1

## 2017-12-17 MED ORDER — SODIUM CHLORIDE 0.9 % IV SOLN
1.0000 g | INTRAVENOUS | Status: DC
  Administered 2017-12-18: 1 g via INTRAVENOUS
  Filled 2017-12-17: qty 10

## 2017-12-17 MED ORDER — ONDANSETRON HCL 4 MG/2ML IJ SOLN
4.0000 mg | Freq: Four times a day (QID) | INTRAMUSCULAR | Status: DC | PRN
Start: 2017-12-17 — End: 2017-12-22

## 2017-12-17 MED ORDER — ACETAMINOPHEN 325 MG PO TABS: 650.0000 mg | ORAL_TABLET | Freq: Four times a day (QID) | ORAL | Status: DC | PRN

## 2017-12-17 NOTE — Plan of Care (Signed)
  Problem: Education: Goal: Knowledge of General Education information will improve Outcome: Progressing   Problem: Nutrition: Goal: Adequate nutrition will be maintained Outcome: Progressing   Problem: Safety: Goal: Ability to remain free from injury will improve Outcome: Progressing   Problem: Skin Integrity: Goal: Risk for impaired skin integrity will decrease Outcome: Progressing

## 2017-12-17 NOTE — ED Provider Notes (Signed)
[facility] EMERGENCY DEPARTMENT Provider Note   CSN: [id number] Arrival date & time: 12/16/17  2310     History   Chief Complaint Chief Complaint  Patient presents with  . Post-op Problem    HPI Michael Welch is a 22 y.o. male.  Patient presents with pain to his left knee and ankle that radiates to his hip.  He is status post washout for septic arthritis on June 18 by Dr. Eulah Pont.  He left the hospital AMA and was given doxycycline to take twice a day which she has been taking.  He has been to the hospital in [place] 3 times this week for pain.  They wanted to admit him yesterday but he wanted to come here instead.  He denies fever at home but has had chills and nausea.  No vomiting.  No abdominal pain.  States compliance with his antibiotics.  Denies IV drug abuse.  Patient complains of worsening swelling and erythema to his left foot and ankle as well as knee.  He is having more pain in his left hip which is new.  Knee and ankle pain are worse with any attempted movement. 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  The history is provided by the patient.    Past Medical History:  Diagnosis Date  . MRSA (methicillin resistant Staphylococcus aureus)     Patient Active Problem List   Diagnosis Date Noted  . History of gonorrhea   . Gonorrhea 12/08/2017  . SIRS (systemic inflammatory response syndrome) (HCC) 12/07/2017  . 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 Effusion of left knee 12/07/2017  . Left ankle effusion 12/07/2017  . Septic arthritis (HCC) 12/07/2017  . Right wrist pain 01/28/2017    Past Surgical History:  Procedure Laterality Date  . ANKLE ARTHROSCOPY Left 12/07/2017   Procedure: ANKLE ARTHROSCOPY IRRIGATION AND DEBRIDMENT;  Surgeon: Sheral Apley, MD;  Location: [facility] OR;  Service: Orthopedics;  Laterality: Left;  . KNEE ARTHROSCOPY Left 12/07/2017   Procedure:  ARTHROSCOPIC KNEE IRRIGATION AND DEBRIDMENT;  Surgeon: Sheral Apley, MD;  Location[facility];  Service: Orthopedics;  Laterality: Left;  . TYMPANOSTOMY          Home Medications    Prior to Admission medications   Medication Sig Start Date End Date Taking? Authorizing Provider  aspirin EC 81 MG tablet Take 1 tablet (81 mg total) by mouth daily. For DVT Prophylaxis. 12/08/17 01/07/18  Rama, Maryruth Bun, MD  Aspirin-Acetaminophen-Caffeine (EXCEDRIN MIGRAINE PO) Take 1 tablet by mouth 3 (three) times daily as needed.    [provider]  doxycycline (VIBRAMYCIN) 100 MG capsule Take 1 capsule (100 mg total) by mouth 2 (two) times daily. 12/08/17   Rama, Maryruth Bun, MD    Family History Family History  Problem Relation Age of Onset  . Healthy Mother   . Healthy Father   . Crohn's disease Neg Hx     Social History Social History   Tobacco Use  . Smoking status: Current Every Day Smoker    Packs/day: 0.50  . Smokeless tobacco: Never Used  Substance Use Topics  . Alcohol use: No  . Drug use: No     Allergies   Sulfa antibiotics   Review of Systems Review of Systems  Constitutional: Positive for activity change, appetite change and fever.  HENT: Positive for congestion.   Respiratory: Negative for cough, chest tightness and shortness of breath.   Cardiovascular:  Positive for leg swelling. Negative for chest pain.  Gastrointestinal: Negative for abdominal pain, nausea and vomiting.  Genitourinary: Negative for dysuria, hematuria and testicular pain.  Musculoskeletal: Positive for arthralgias and myalgias.  Skin: Positive for wound. Negative for rash.  Neurological: Negative for dizziness, weakness and headaches.    all other systems are negative except as noted in the HPI and PMH.    Physical Exam Updated Vital Signs BP 123/75   Pulse 93   Temp 98.8 F (37.1 C) (Oral)   Resp 18   SpO2 99%   Physical Exam  Constitutional: He is oriented to person, place,  and time. He appears well-developed and well-nourished. No distress.  uncomfortable  HENT:  Head: Normocephalic and atraumatic.  Mouth/Throat: Oropharynx is clear and moist. No oropharyngeal exudate.  Eyes: Pupils are equal, round, and reactive to light. Conjunctivae and EOM are normal.  Neck: Normal range of motion. Neck supple.  No meningismus.  Cardiovascular: Normal rate, regular rhythm, normal heart sounds and intact distal pulses.  No murmur heard. Pulmonary/Chest: Effort normal and breath sounds normal. No respiratory distress.  Abdominal: Soft. There is no tenderness. There is no rebound and no guarding.  Musculoskeletal: He exhibits edema and tenderness.  Patient with pain to range of motion of left hip and knee.  knee flexion and extension are intact with range of motion slightly decreased.  There is no surrounding erythema or warmth.  There is redness, edema, erythema extending across left ankle and dorsal foot.  Intact DP pulse.  Compartments are soft.  Reduced range of motion of ankle due to pain  Neurological: He is alert and oriented to person, place, and time. No cranial nerve deficit. He exhibits normal muscle tone. Coordination normal.  No ataxia on finger to nose bilaterally. No pronator drift. 5/5 strength throughout. CN 2-12 intact.Equal grip strength. Sensation intact.   Skin: Skin is warm. There is erythema.  Psychiatric: He has a normal mood and affect. His behavior is normal.  Nursing note and vitals reviewed.    ED Treatments / Results  Labs (all labs ordered are listed, but only abnormal results are displayed) Labs Reviewed  CBC WITH DIFFERENTIAL/PLATELET - Abnormal; Notable for the following components:      Result Value   WBC 10.8 (*)    HCT 38.9 (*)    Platelets 476 (*)    Monocytes Absolute 1.3 (*)    All other components within normal limits  COMPREHENSIVE METABOLIC PANEL - Abnormal; Notable for the following components:   Glucose, Bld 112 (*)     Albumin 3.1 (*)    All other components within normal limits  CULTURE, BLOOD (ROUTINE X 2)  CULTURE, BLOOD (ROUTINE X 2) W REFLEX TO ID PANEL  URINALYSIS, ROUTINE W REFLEX MICROSCOPIC  I-STAT CG4 LACTIC ACID, ED    EKG None  Radiology Dg Knee 2 Views Left  Result Date: 12/17/2017 CLINICAL DATA:  Septic arthritis follow-up. EXAM: LEFT KNEE - 1-2 VIEW COMPARISON:  12/07/2017 FINDINGS: No evidence of acute fracture or joint dislocation. Interval decrease in suprapatellar joint effusion. No evidence of arthropathy or other focal bone abnormality. Soft tissues are unremarkable. IMPRESSION: No acute fracture, suspicious osseous lesion or bone destruction. Near complete resolution of suprapatellar joint effusion. Electronically Signed   By: Tollie Eth M.D.   On: 12/17/2017 03:20   Dg Ankle Complete Left  Result Date: 12/17/2017 CLINICAL DATA:  Septic arthritis EXAM: LEFT ANKLE COMPLETE - 3+ VIEW COMPARISON:  12/07/2017 FINDINGS: Stable soft  tissue swelling about the malleoli. Interval decrease in ankle joint effusion. No frank bone destruction or fracture. IMPRESSION: Mild-to-moderate soft tissue swelling about the malleoli persists. Interval decrease in tibiotalar joint effusion however is noted. No frank bone destruction is seen. Electronically Signed   By: Tollie Ethavid  Kwon M.D.   On: 12/17/2017 03:25   Dg Hip Unilat W Or Wo Pelvis 2-3 Views Left  Result Date: 12/17/2017 CLINICAL DATA:  Septic arthritis follow-up EXAM: DG HIP (WITH OR WITHOUT PELVIS) 2-3V LEFT COMPARISON:  None. FINDINGS: There is no evidence of hip fracture or dislocation. Hip joints are maintained without soft tissue swelling to suggest joint effusion. No frank bone destruction. There is no evidence of arthropathy or other focal bone abnormality. IMPRESSION: Negative. Electronically Signed   By: Tollie Ethavid  Kwon M.D.   On: 12/17/2017 03:23    Procedures Procedures (including critical care time)  Medications Ordered in ED Medications   cefTRIAXone (ROCEPHIN) 1 g in sodium chloride 0.9 % 100 mL IVPB (has no administration in time range)  morphine 4 MG/ML injection 4 mg (has no administration in time range)  ondansetron (ZOFRAN) injection 4 mg (has no administration in time range)     Initial Impression / Assessment and Plan / ED Course  I have reviewed the triage vital signs and the nursing notes.  Pertinent labs & imaging results that were available during my care of the patient were reviewed by me and considered in my medical decision making (see chart for details).    Patient returns with left leg pain and edema and redness after leaving AMA on June 19.  He was treated for septic arthritis of both knee and ankle with I&D's performed by Dr. Eulah PontMurphy.  He left AMA on doxycycline per ID recommendations for concern for GC arthritis.  Patient seen at outside hospital x3 this week with arthrocentesis of left knee performed on June 26 that showed 11,000 white cells.  IVF, cultures, Xrays, start abx.   Orthopedic surgery was paged multiple times since 2 AM without a response. Patient remains hemodynmically stable.  X-ray shows worsening soft tissue swelling of ankle. After no response from orthopedic surgery, call Dr. Greig RightMurphy's cell phone directly. He apologized for the delay and stated orthopedics was covered by someone else last night.  His group will be seeing patient this morning and request medical admission.  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  Hesitate to aspirate ankle joint in the ED with surrounding erythema and cellulitis.  Plan admission for IV antibiotics and orthopedic evaluation.  Discussed with Dr. Toniann FailKakrakandy Final Clinical Impressions(s) / ED Diagnoses   Final diagnoses:  Septic arthritis of left ankle, due to unspecified organism [facility](HCC)  Pyogenic arthritis of multiple sites, due to unspecified organism  [facility](HCC)    ED Discharge Orders    None       Glynn Octaveancour, Arleatha Philipps, MD 12/17/17 [phone]

## 2017-12-17 NOTE — H&P (Addendum)
History and Physical    Michael Welch ZOX:[id number] DOB: 1996-01-23 DOA: 12/16/2017   PCP: Estanislado PandySasser, Paul W, MD   Patient coming from:  Home    Chief Complaint: Left hip pain   HPI: Michael Welch is a 22 y.o. male with medical history significant for septic arthritis of the left knee and ankle, status post washout on December 07, 2017 by Dr. Eulah PontMurphy.  The patient left the hospital AMA at the time, and was prescribed doxycycline to take twice a day, which he reports has been taking.  Since then, the patient has been in the hospital in [place] 3 times this week for pain control.  Yesterday, they wanted to admit him while in [place], but he wanted to be evaluated here at [facility].  He now presented with left hip and referred groin pain, left knee pain, and left foot swelling.  He denies any discharges from the areas of debridement, or penile discharge or swelling.  The patient denies fevers at home, had intermittent chills.  He has some nausea without vomiting.  He denies any abdominal pain or constipation or diarrhea.  He denies any headache or vision changes.  He denies any dysuria or gross hematuria.  He denies any IV drug abuse.  He denies any alcohol, he continues to smoke.   ED Course:  BP 119/64 (BP Location: Right Arm)   Pulse 75   Temp 98.9 F (37.2 C) (Oral)   Resp 18   SpO2 100%   Patient has normal chemistries.  Lactic acid 0.69.  White count 10.8.  Hemoglobin 13, platelets 476. Blood cultures are pending. Last chlamydia and Neisseria gonorrhea test on 12/08/2017 they were negative Urine pending Last HIV on 12/07/2017 is negative Last Gram stain and blood culture on 12/07/2017 was remarkable for moderate WBCs but no organisms were seen.  At the time was negative Left ankle, left hip x-ray shows mild to moderate soft tissue swelling about the malleoli, but the joint effusion is decreased.  No frank bone destruction seen Left knee x-ray without acute fracture, or osseous lesions or  bone destruction.  There is near complete resolution of suprapatellar joint effusion Give IV Rocephin x1, IV fluids and antiemetics with also IV morphine with good control of his symptoms.  Review of Systems:  As per HPI otherwise all other systems reviewed and are negative  Past Medical History:  Diagnosis Date  . MRSA (methicillin resistant Staphylococcus aureus)     Past Surgical History:  Procedure Laterality Date  . ANKLE ARTHROSCOPY Left 12/07/2017   Procedure: ANKLE ARTHROSCOPY IRRIGATION AND DEBRIDMENT;  Surgeon: Sheral ApleyMurphy, Timothy D, MD;  [facility];  Service: Orthopedics;  Laterality: Left;  . KNEE ARTHROSCOPY Left 12/07/2017   Procedure: ARTHROSCOPIC KNEE IRRIGATION AND DEBRIDMENT;  Surgeon: Sheral ApleyMurphy, Timothy D, MD;  [facility];  Service: Orthopedics;  Laterality: Left;  . TYMPANOSTOMY      Social History Social History   Socioeconomic History  . Marital status: Single    Spouse name: Not on file  . Number of children: Not on file  . Years of education: Not on file  . Highest education level: Not on file  Occupational History  . Not on file  Social Needs  . Financial resource strain: Not on file  . Food insecurity:    Worry: Not on file    Inability: Not on file  . Transportation needs:    Medical: Not on file    Non-medical: Not on file  Tobacco  Use  . Smoking status: Current Every Day Smoker    Packs/day: 0.50  . Smokeless tobacco: Never Used  Substance and Sexual Activity  . Alcohol use: No  . Drug use: No  . Sexual activity: Yes  Lifestyle  . Physical activity:    Days per week: Not on file    Minutes per session: Not on file  . Stress: Not on file  Relationships  . Social connections:    Talks on phone: Not on file    Gets together: Not on file    Attends religious service: Not on file    Active member of club or organization: Not on file    Attends meetings of clubs or organizations: Not on file    Relationship status: Not on file  .  Intimate partner violence:    Fear of current or ex partner: Not on file    Emotionally abused: Not on file    Physically abused: Not on file    Forced sexual activity: Not on file  Other Topics Concern  . Not on file  Social History Narrative  . Not on file     Allergies  Allergen Reactions  . Sulfa Antibiotics Anaphylaxis, Nausea And Vomiting and Swelling    Angioedema also  . Other Hives    (Sweet) green peas    Family History  Problem Relation Age of Onset  . Healthy Mother   . Healthy Father   . Crohn's disease Neg Hx        Prior to Admission medications   Medication Sig Start Date End Date Taking? Authorizing Provider  aspirin EC 81 MG tablet Take 1 tablet (81 mg total) by mouth daily. For DVT Prophylaxis. 12/08/17 01/07/18 Yes Rama, Maryruth Bun, MD  Aspirin-Acetaminophen-Caffeine (EXCEDRIN MIGRAINE PO) Take 1 tablet by mouth 3 (three) times daily as needed (for migraines or headaches).    Yes [provider]  diclofenac sodium (VOLTAREN) 1 % GEL Apply 2 g topically 4 (four) times daily as needed (to painful sites, as directed).    Yes [provider]  doxycycline (VIBRAMYCIN) 100 MG capsule Take 1 capsule (100 mg total) by mouth 2 (two) times daily. 12/08/17  Yes Rama, Maryruth Bun, MD  Hyprom-Naphaz-Polysorb-Zn Sulf (CLEAR EYES COMPLETE OP) Apply 2 drops to eye 3 (three) times daily as needed (to affected eye for relief of redness).   Yes [provider]     Physical Exam:  Vitals:   12/17/17 0719 12/17/17 0730 12/17/17 0750 12/17/17 0813  BP: (!) 92/55 (!) 96/53 (!) 105/59 119/64  Pulse: 63 70  75  Resp: 16   18  Temp:    98.9 F (37.2 C)  TempSrc:    Oral  SpO2: 96% 96%  100%   Constitutional: NAD, calm, uncomfortable due to hip pain, disheveled appearance eyes: PERRL, lids and conjunctivae normal ENMT: Mucous membranes are moist, without exudate or lesions  Neck: normal, supple, no masses, no thyromegaly Respiratory: clear to  auscultation bilaterally, no wheezing, no crackles. Normal respiratory effort  Cardiovascular: Regular rate and rhythm,  murmur, rubs or gallops. No extremity edema. 2+ pedal pulses. No carotid bruits.  Abdomen: Soft, non tender, No hepatosplenomegaly. Bowel sounds positive.  Musculoskeletal: He reports pain with slight decrease range of motion of the left hip and knee, normal knee flexion and extension, there is no surrounding erythema or warmth.  There is normal distal pulses. Skin: no jaundice, No lesions.  Neurologic: Sensation intact  Strength equal in  all extremities Psychiatric:   Alert and oriented x 3. Normal mood.     Labs on Admission: I have personally reviewed following labs and imaging studies  CBC: Recent Labs  Lab 12/17/17 0219  WBC 10.8*  NEUTROABS 7.3  HGB 13.0  HCT 38.9*  MCV 89.6  PLT 476*    Basic Metabolic Panel: Recent Labs  Lab 12/17/17 0219  NA 136  K 4.0  CL 102  CO2 24  GLUCOSE 112*  BUN 8  CREATININE 0.77  CALCIUM 9.3    GFR: Estimated Creatinine Clearance: 159.3 mL/min (by C-G formula based on SCr of 0.77 mg/dL).  Liver Function Tests: Recent Labs  Lab 12/17/17 0219  AST 19  ALT 21  ALKPHOS 60  BILITOT 0.7  PROT 7.2  ALBUMIN 3.1*   No results for input(s): LIPASE, AMYLASE in the last 168 hours. No results for input(s): AMMONIA in the last 168 hours.  Coagulation Profile: No results for input(s): INR, PROTIME in the last 168 hours.  Cardiac Enzymes: No results for input(s): CKTOTAL, CKMB, CKMBINDEX, TROPONINI in the last 168 hours.  BNP (last 3 results) No results for input(s): PROBNP in the last 8760 hours.  HbA1C: No results for input(s): HGBA1C in the last 72 hours.  CBG: No results for input(s): GLUCAP in the last 168 hours.  Lipid Profile: No results for input(s): CHOL, HDL, LDLCALC, TRIG, CHOLHDL, LDLDIRECT in the last 72 hours.  Thyroid Function Tests: No results for input(s): TSH, T4TOTAL, FREET4, T3FREE,  THYROIDAB in the last 72 hours.  Anemia Panel: No results for input(s): VITAMINB12, FOLATE, FERRITIN, TIBC, IRON, RETICCTPCT in the last 72 hours.  Urine analysis:    Component Value Date/Time   COLORURINE YELLOW 12/07/2017 0320   APPEARANCEUR CLEAR 12/07/2017 0320   LABSPEC 1.013 12/07/2017 0320   PHURINE 6.0 12/07/2017 0320   GLUCOSEU NEGATIVE 12/07/2017 0320   HGBUR MODERATE (A) 12/07/2017 0320   BILIRUBINUR NEGATIVE 12/07/2017 0320   KETONESUR NEGATIVE 12/07/2017 0320   PROTEINUR NEGATIVE 12/07/2017 0320   UROBILINOGEN 0.2 10/14/2010 1238   NITRITE NEGATIVE 12/07/2017 0320   LEUKOCYTESUR NEGATIVE 12/07/2017 0320    Sepsis Labs: @LABRCNTIP (procalcitonin:4,lacticidven:4) ) Recent Results (from the past 240 hour(s))  MRSA PCR Screening     Status: None   Collection Time: 12/07/17 10:00 AM  Result Value Ref Range Status   MRSA by PCR NEGATIVE NEGATIVE Final    Comment:        The GeneXpert MRSA Assay (FDA approved for NASAL specimens only), is one component of a comprehensive MRSA colonization surveillance program. It is not intended to diagnose MRSA infection nor to guide or monitor treatment for MRSA infections. Performed at [facility], [address] [place], [place] [address]      Radiological Exams on Admission: Dg Knee 2 Views Left  Result Date: 12/17/2017 CLINICAL DATA:  Septic arthritis follow-up. EXAM: LEFT KNEE - 1-2 VIEW COMPARISON:  12/07/2017 FINDINGS: No evidence of acute fracture or joint dislocation. Interval decrease in suprapatellar joint effusion. No evidence of arthropathy or other focal bone abnormality. Soft tissues are unremarkable. IMPRESSION: No acute fracture, suspicious osseous lesion or bone destruction. Near complete resolution of suprapatellar joint effusion. Electronically Signed   By: Tollie Eth M.D.   On: 12/17/2017 03:20   Dg Ankle Complete Left  Result Date: 12/17/2017 CLINICAL DATA:  Septic arthritis EXAM: LEFT ANKLE  COMPLETE - 3+ VIEW COMPARISON:  12/07/2017 FINDINGS: Stable soft tissue swelling about the malleoli. Interval decrease in  ankle joint effusion. No frank bone destruction or fracture. IMPRESSION: Mild-to-moderate soft tissue swelling about the malleoli persists. Interval decrease in tibiotalar joint effusion however is noted. No frank bone destruction is seen. Electronically Signed   By: Tollie Eth M.D.   On: 12/17/2017 03:25   Dg Hip Unilat W Or Wo Pelvis 2-3 Views Left  Result Date: 12/17/2017 CLINICAL DATA:  Septic arthritis follow-up EXAM: DG HIP (WITH OR WITHOUT PELVIS) 2-3V LEFT COMPARISON:  None. FINDINGS: There is no evidence of hip fracture or dislocation. Hip joints are maintained without soft tissue swelling to suggest joint effusion. No frank bone destruction. There is no evidence of arthropathy or other focal bone abnormality. IMPRESSION: Negative. Electronically Signed   By: Tollie Eth M.D.   On: 12/17/2017 03:23    EKG: Independently reviewed.  Assessment/Plan Principal Problem:   Septic arthritis (HCC) Active Problems:   SIRS (systemic inflammatory response syndrome) (HCC)   Polyarthritis   History of gonorrhea   Tobacco abuse   Left hip pain, in the setting of recent history of  Gonococcal septic arthritis,status post I+D/ washout on December 07, 2017 by Dr. Eulah Pont.  Orthopedic consult has been requested, to evaluate for any further procedures.Patient has normal chemistries.  Lactic acid 0.69.  White count 10.8.  Hemoglobin 13, platelets 476.Blood cultures are pending.  Left ankle, left hip and left knee x-rays without acute findings.  Patient received Rocephin IV vancomycin, IV fluids and antiemetics, IV morphine with better control of his symptoms. Admit to MedSurg inpatient MRI of the left hip for occult abnormalities Continue Rocephin and vancomycin CBC in a.m. Follow blood cultures Follow UDS report  IV fluids We will keep the patient n.p.o. for now, until further  instructions by orthopedics Continue pain control with oral and IV meds  Tobacco abuse     Nicotine patch  Counseled cessation   DVT prophylaxis: SCD  Code Status:    Full  Family Communication:  Discussed with patient Disposition Plan: Expect patient to be discharged to home after condition improves Consults called:    Ortho by EDP  Admission status:  IP Medsurg   Marlowe Kays, PA-C [facility]   Amion text  [phone]   12/17/2017, 8:31 AM

## 2017-12-17 NOTE — Progress Notes (Signed)
Patient ID: Michael Welch, male   DOB: 09-18-1995, 22 y.o.   MRN: [id number]   LOS: 0 days   Subjective: Michael Welch was readmitted with continued and increased pain in his joints. He mostly c/o his hip and knee, felt that the ankle was about the same. He wonders if the hip might be hurting as a response to having to use crutches. He describes the pain localized in the groin and bilateral.   Objective: Vital signs in last 24 hours: Temp:  [98.6 F (37 C)-98.9 F (37.2 C)] 98.9 F (37.2 C) (06/28 0813) Pulse Rate:  [63-98] 75 (06/28 0813) Resp:  [16-18] 18 (06/28 0813) BP: (92-128)/(53-77) 119/64 (06/28 0813) SpO2:  [96 %-100 %] 100 % (06/28 0813)     Laboratory  CBC Recent Labs    12/17/17 0219  WBC 10.8*  HGB 13.0  HCT 38.9*  PLT 476*   BMET Recent Labs    12/17/17 0219  NA 136  K 4.0  CL 102  CO2 24  GLUCOSE 112*  BUN 8  CREATININE 0.77  CALCIUM 9.3     Physical Exam General appearance: alert and no distress  LLE No traumatic wounds, ecchymosis, or rash  Knee TTP, esp medially. Mild effusion. PROM 170-100  Ankle mod edema, PROM through about 30 degrees  Sens DPN, SPN, TN intact  Motor EHL, ext, flex, evers 5/5  DP 2+, PT 2+   Assessment/Plan: Arthritis -- I do not think pt needs surgical intervention at this time. Continue IV abx. Will follow along with you for changes.    Freeman CaldronMichael J. Jayr Lupercio, PA-C Orthopedic Surgery [phone] 12/17/2017

## 2017-12-17 NOTE — Progress Notes (Signed)
Pharmacy Antibiotic Note  Michael Welch is a 22 y.o. male admitted on 12/16/2017 with septic arthritis.  Pharmacy has been consulted for vancomycin dosing.  Patient reportedly had arthroscopic knee and ankle I&D on 6/18- he went to that hospital in [place] on 6/26 but left and came here since this is where surgery was performed. Per Dr. York Ramama's discharge summary on 6/19 patient refused to stay to continue receiving IV antibiotics (was on Rocephin + vancomycin- no trough obtained), and left AMA with a prescription for PO doxycycline per ID recommendations- patient reports his last dose of PO doxy was on 6/27. Patient reported to ED RN that he was unaware surgeon wanted him to stay in the hospital longer when he was discharged.  Per ID notes from last admission, patient was recently treated for gonorrhea infection and concern was septic arthritis could be caused by the same organism since up to 50% of GC arthritis cases have no growth on culture.  Patient has already received vancomycin 1500mg  IV x1 in the ED. SCr 0.77 with est CrCl >13200mL/min- stable.   Plan: Vancomycin 1g IV q8h Follow ortho and ID plans Follow renal function, clinical progression, trough at steady state      Temp (24hrs), Avg:98.7 F (37.1 C), Min:98.6 F (37 C), Max:98.8 F (37.1 C)  Recent Labs  Lab 12/17/17 0219 12/17/17 0230  WBC 10.8*  --   CREATININE 0.77  --   LATICACIDVEN  --  0.69    Estimated Creatinine Clearance: 159.3 mL/min (by C-G formula based on SCr of 0.77 mg/dL).    Allergies  Allergen Reactions  . Sulfa Antibiotics Anaphylaxis, Nausea And Vomiting and Swelling    Angioedema also  . Other Hives    (Sweet) green peas    Antimicrobials this admission: Vancomycin 6/28 >>   Dose adjustments this admission: n/a  Microbiology results: Previous admission-  6/17 BCx: neg 6/18 L knee fluid: neg 6/18 GC/chlamydia: neg  6/28 BCx:  Thank you for allowing pharmacy to be a part of this  patient's care.  Elita Dame D. Klaire Court, PharmD, BCPS Clinical Pharmacist [phone] Please check AMION for all [facility] numbers 12/17/2017 7:44 AM

## 2017-12-17 NOTE — ED Notes (Signed)
Attempted report 

## 2017-12-18 ENCOUNTER — Inpatient Hospital Stay (HOSPITAL_COMMUNITY): Payer: Managed Care, Other (non HMO)

## 2017-12-18 DIAGNOSIS — M01X0 Direct infection of unspecified joint in infectious and parasitic diseases classified elsewhere: Secondary | ICD-10-CM

## 2017-12-18 DIAGNOSIS — R6510 Systemic inflammatory response syndrome (SIRS) of non-infectious origin without acute organ dysfunction: Secondary | ICD-10-CM

## 2017-12-18 DIAGNOSIS — Z720 Tobacco use: Secondary | ICD-10-CM

## 2017-12-18 DIAGNOSIS — Z8619 Personal history of other infectious and parasitic diseases: Secondary | ICD-10-CM

## 2017-12-18 DIAGNOSIS — M130 Polyarthritis, unspecified: Secondary | ICD-10-CM

## 2017-12-18 DIAGNOSIS — A498 Other bacterial infections of unspecified site: Secondary | ICD-10-CM

## 2017-12-18 DIAGNOSIS — M009 Pyogenic arthritis, unspecified: Principal | ICD-10-CM

## 2017-12-18 DIAGNOSIS — F17210 Nicotine dependence, cigarettes, uncomplicated: Secondary | ICD-10-CM

## 2017-12-18 LAB — BASIC METABOLIC PANEL
ANION GAP: 8 (ref 5–15)
BUN: 6 mg/dL (ref 6–20)
CO2: 27 mmol/L (ref 22–32)
Calcium: 9.4 mg/dL (ref 8.9–10.3)
Chloride: 103 mmol/L (ref 98–111)
Creatinine, Ser: 0.77 mg/dL (ref 0.61–1.24)
GFR calc Af Amer: >60 mL/min (ref >60)
GLUCOSE: 105 mg/dL — AB (ref 70–99)
POTASSIUM: 4.2 mmol/L (ref 3.5–5.1)
Sodium: 138 mmol/L (ref 135–145)

## 2017-12-18 LAB — CBC
HEMATOCRIT: 37.1 % — AB (ref 39.0–52.0)
HEMOGLOBIN: 12.1 g/dL — AB (ref 13.0–17.0)
MCH: 29.7 pg (ref 26.0–34.0)
MCHC: 32.6 g/dL (ref 30.0–36.0)
MCV: 90.9 fL (ref 78.0–100.0)
Platelets: 442 10*3/uL — ABNORMAL HIGH (ref 150–400)
RBC: 4.08 MIL/uL — ABNORMAL LOW (ref 4.22–5.81)
RDW: 11.9 % (ref 11.5–15.5)
WBC: 9.1 10*3/uL (ref 4.0–10.5)

## 2017-12-18 MED ORDER — CEFAZOLIN SODIUM-DEXTROSE 2-4 GM/100ML-% IV SOLN
2.0000 g | Freq: Three times a day (TID) | INTRAVENOUS | Status: DC
  Filled 2017-12-18: qty 100

## 2017-12-18 MED ORDER — SODIUM CHLORIDE 0.9 % IV SOLN
2.0000 g | INTRAVENOUS | Status: DC
  Administered 2017-12-18 – 2017-12-22 (×5): 2 g via INTRAVENOUS
  Filled 2017-12-18 (×5): qty 20

## 2017-12-18 NOTE — Consult Note (Signed)
Date of Admission:  12/16/2017          Reason for Consult: Polyarticular septic arthritis   Referring Provider: Dr. Waymon Amato   Assessment:  1. Polyarticular septic arthritis with left knee and left ankle status post debridement 2. Right foot and toe pain 3. She recently treated on a coccal infection of GU tract 4. Tattoos  Plan:  1. Back to ceftriaxone to cover for possible disseminated gonococcal infection though I am more suspicious for skin flora 2. Check for viral hepatitides and check inflammatory markers 3. Right foot with plain films and consider MRI of right foot  Principal Problem:   Septic arthritis (HCC) Active Problems:   SIRS (systemic inflammatory response syndrome) (HCC)   Polyarthritis   History of gonorrhea   Tobacco abuse   Scheduled Meds: . nicotine  14 mg Transdermal Daily   Continuous Infusions: . sodium chloride 1,000 mL (12/18/17 0036)  . cefTRIAXone (ROCEPHIN)  IV 2 g (12/18/17 1346)   PRN Meds:.acetaminophen **OR** acetaminophen, bisacodyl, HYDROcodone-acetaminophen, morphine injection, ondansetron, senna-docusate  HPI: Michael Welch is a 22 y.o. male who had been diagnosed with gonorrhea via urine testing roughly a month prior to presentation.  3 weeks after being treated with ceftriaxone and azithromycin he developed a polyarticular septic arthritis with swelling of his left knee and ankle.  He was admitted to [facility] on 17 June and given broad-spectrum antibiotics in the form of vancomycin and Zosyn.  This latter event is quite unfortunate because it always compromises our ability to get diagnostic cultures to guide therapy.  He had formal I&D by Dr. Eulah Pont in the operating room of the left knee and ankle.  The former was overtly purulent based on Dr. Greig Right operative report.  His cell count was consistent with a bacterial pyogenic process with neutrophil predominance.  No organisms were isolated which is unsurprisingly given the fact he  was given antecedent antibiotics.  He was also tested for gonorrhea and chlamydia from oropharynx and them urine and this was negative though this was done while he was on antibiotics.  He was seen by our infectious disease team by Rexene Alberts. She was concerned for DGI and wished to treat with ceftriaxone pit.  The patient however wanted to go home with oral antibiotic and was discharged with doxycycline which he claims he was taking.  In the interval he has had worsening pain in his left knee and ankle as well as new pain in his right foot.  He has been admitted to the hospitalist service and has been evaluated by orthopedic surgery who soon he no need for intervention at this time.  He was on vancomycin and ceftriaxone.  I initially recommended narrowing to cefazolin but this before I knew about the question of possible disseminated gonococcal infection.  Therefore I will change him back to ceftriaxone to cover for possible DGI.  I would follow him clinically and see if he needs intervention in either his knee or ankle.  I would also like to image his right foot where he has new pain and a lesion on the skin that his wife says is consistent with what he had on the opposite foot prior to onset of his septic ankle and knee.  While the patient states he has no history of intravenous drug use and that he "hates needles" there is much about him that makes me have a suspicion for injection drug use.  His wife who is at the bedside and  is pregnant states that she was treated for gonorrhea 2 days after the patient was.     Review of Systems: Review of Systems  Constitutional: Negative for chills, diaphoresis, fever, malaise/fatigue and weight loss.  HENT: Negative for congestion, hearing loss, sore throat and tinnitus.   Eyes: Negative for blurred vision and double vision.  Respiratory: Negative for cough, sputum production, shortness of breath and wheezing.   Cardiovascular: Negative for chest  pain, palpitations and leg swelling.  Gastrointestinal: Negative for abdominal pain, blood in stool, constipation, diarrhea, heartburn, melena, nausea and vomiting.  Genitourinary: Negative for dysuria, flank pain and hematuria.  Musculoskeletal: Positive for joint pain and myalgias. Negative for back pain and falls.  Skin: Negative for itching and rash.  Neurological: Negative for dizziness, sensory change, focal weakness, loss of consciousness, weakness and headaches.  Endo/Heme/Allergies: Does not bruise/bleed easily.  Psychiatric/Behavioral: Positive for depression. Negative for memory loss and suicidal ideas. The patient is not nervous/anxious.     Past Medical History:  Diagnosis Date  . MRSA (methicillin resistant Staphylococcus aureus)     Social History   Tobacco Use  . Smoking status: Current Every Day Smoker    Packs/day: 0.50  . Smokeless tobacco: Never Used  Substance Use Topics  . Alcohol use: No  . Drug use: No    Family History  Problem Relation Age of Onset  . Healthy Mother   . Healthy Father   . Crohn's disease Neg Hx    Allergies  Allergen Reactions  . Sulfa Antibiotics Anaphylaxis, Nausea And Vomiting and Swelling    Angioedema also  . Other Hives    (Sweet) green peas    OBJECTIVE: Blood pressure 109/61, pulse 77, temperature 98.1 F (36.7 C), temperature source Oral, resp. rate 18, SpO2 99 %.  Physical Exam  Constitutional: He is oriented to person, place, and time. He appears well-nourished. He is cooperative. He does not appear ill. No distress.  HENT:  Head: Normocephalic and atraumatic.  Right Ear: Hearing and external ear normal.  Left Ear: Hearing and external ear normal.  Nose: No rhinorrhea or nasal deformity. No epistaxis.  Eyes: Pupils are equal, round, and reactive to light. Conjunctivae and EOM are normal. Right conjunctiva is not injected. Left conjunctiva is not injected. No scleral icterus.  Neck: Normal range of motion. Neck  supple. No JVD present.  Cardiovascular: Normal rate, regular rhythm, S1 normal and S2 normal.  No murmur heard. Pulmonary/Chest: Effort normal. No respiratory distress.  Abdominal: Soft. Normal appearance. He exhibits no distension and no ascites. There is no hepatosplenomegaly. There is no tenderness.  Musculoskeletal:       Right shoulder: Normal.       Left shoulder: Normal.       Right hip: Normal.       Left hip: Normal.       Right knee: Normal.       Left knee: He exhibits decreased range of motion, swelling and effusion.       Right ankle: Tenderness.       Left ankle: He exhibits decreased range of motion and swelling.       Feet:  Lymphadenopathy:       Head (right side): No submandibular, no preauricular and no posterior auricular adenopathy present.       Head (left side): No submandibular, no preauricular and no posterior auricular adenopathy present.    He has no cervical adenopathy.       Right cervical: No superficial  cervical and no deep cervical adenopathy present.      Left cervical: No superficial cervical and no deep cervical adenopathy present.  Neurological: He is alert and oriented to person, place, and time. He has normal strength. No sensory deficit. Coordination and gait normal.  Skin: Skin is warm, dry and intact. No abrasion, no bruising, no ecchymosis, no lesion and no rash noted. He is not diaphoretic. There is erythema. No cyanosis. No pallor. Nails show no clubbing.  Psychiatric: He has a normal mood and affect. His speech is normal and behavior is normal. Judgment and thought content normal. Cognition and memory are normal. He is attentive.   Multiple tattoos  Lab Results Lab Results  Component Value Date   WBC 9.1 12/18/2017   HGB 12.1 (L) 12/18/2017   HCT 37.1 (L) 12/18/2017   MCV 90.9 12/18/2017   PLT 442 (H) 12/18/2017    Lab Results  Component Value Date   CREATININE 0.77 12/18/2017   BUN 6 12/18/2017   NA 138 12/18/2017   K 4.2  12/18/2017   CL 103 12/18/2017   CO2 27 12/18/2017    Lab Results  Component Value Date   ALT 21 12/17/2017   AST 19 12/17/2017   ALKPHOS 60 12/17/2017   BILITOT 0.7 12/17/2017     Microbiology: Recent Results (from the past 240 hour(s))  Blood culture (routine x 2)     Status: None (Preliminary result)   Collection Time: 12/17/17  2:15 AM  Result Value Ref Range Status   Specimen Description BLOOD RIGHT ANTECUBITAL  Final   Special Requests   Final    BOTTLES DRAWN AEROBIC AND ANAEROBIC Blood Culture adequate volume   Culture   Final    NO GROWTH 1 DAY Performed at [facility], [address] [place], [place] [address]    Report Status PENDING  Incomplete  Culture, blood (Routine X 2) w Reflex to ID Panel     Status: None (Preliminary result)   Collection Time: 12/17/17  2:20 AM  Result Value Ref Range Status   Specimen Description BLOOD LEFT ANTECUBITAL  Final   Special Requests   Final    BOTTLES DRAWN AEROBIC AND ANAEROBIC Blood Culture adequate volume   Culture   Final    NO GROWTH 1 DAY Performed at [facility], [address] [place], [place] [address]    Report Status PENDING  Incomplete    Acey Lavornelius Van Dam, MD [facility] [phone] pager  12/18/2017, 2:41 PM

## 2017-12-18 NOTE — Progress Notes (Signed)
Pharmacy Antibiotic Note  Michael Welch is a 22 y.o. male admitted on 12/16/2017 with septic arthritis.  Pharmacy has been consulted for cefazolin dosing.   Patient reportedly had arthroscopic knee and ankle I&D on 6/18- he went to that hospital in [place] on 6/26 but left and came here since this is where surgery was performed. Per Dr. York Ramama's discharge summary on 6/19 patient refused to stay to continue receiving IV antibiotics (was on Rocephin + vancomycin- no trough obtained), and left AMA with a prescription for PO doxycycline per ID recommendations- patient reports his last dose of PO doxy was on 6/27. Patient reported to ED RN that he was unaware surgeon wanted him to stay in the hospital longer when he was discharged.  Per ID notes from last admission, patient was recently treated for gonorrhea infection and concern was septic arthritis could be caused by the same organism since up to 50% of GC arthritis cases have no growth on culture.   SCr 0.77 with est CrCl >15900mL/min- stable.   Plan: Cefazolin 2gm IV q8h Follow ortho and ID plans Follow renal function, clinical progression.     Temp (24hrs), Avg:98.8 F (37.1 C), Min:98.7 F (37.1 C), Max:98.8 F (37.1 C)  Recent Labs  Lab 12/17/17 0219 12/17/17 0230 12/18/17 0414  WBC 10.8*  --  9.1  CREATININE 0.77  --  0.77  LATICACIDVEN  --  0.69  --     Estimated Creatinine Clearance: 159.3 mL/min (by C-G formula based on SCr of 0.77 mg/dL).    Allergies  Allergen Reactions  . Sulfa Antibiotics Anaphylaxis, Nausea And Vomiting and Swelling    Angioedema also  . Other Hives    (Sweet) green peas    Antimicrobials this admission: Vancomycin 6/28 >> 6/29 Cefazolin 2/29>>  Dose adjustments this admission: n/a  Microbiology results: Previous admission-  6/17 BCx: neg 6/18 L knee fluid: neg 6/18 GC/chlamydia: neg  6/28 BCx:   Syd Newsome A. Jeanella CrazePierce, PharmD, BCPS Clinical Pharmacist [place] Pager: [phone] Please  utilize Amion for appropriate phone number to reach the unit pharmacist [facility]   12/18/2017 10:13 AM

## 2017-12-18 NOTE — Plan of Care (Signed)
  Problem: Education: Goal: Knowledge of General Education information will improve Outcome: Progressing   Problem: Clinical Measurements: Goal: Will remain free from infection Outcome: Progressing   Problem: Activity: Goal: Risk for activity intolerance will decrease Outcome: Progressing   Problem: Elimination: Goal: Will not experience complications related to bowel motility Outcome: Progressing   Problem: Pain Managment: Goal: General experience of comfort will improve Outcome: Progressing   Problem: Safety: Goal: Ability to remain free from injury will improve Outcome: Progressing   

## 2017-12-18 NOTE — Progress Notes (Signed)
PROGRESS NOTE   Michael Welch  ZOX:[id number]    DOB: [birth year]    DOA: 12/16/2017  PCP: Estanislado Pandy, MD   I have briefly reviewed patients previous medical records in [facility].  Brief Narrative:  22 year old male with history of substance abuse, recent hospitalization 12/06/2017-12/08/2017 for left ankle and knee pain, suspected septic arthritis, underwent joint aspiration followed by arthroscopic knee irrigation and debridement by orthopedics on 12/07/2017, all cultures negative, initially treated with Zosyn and vancomycin then transitioned to Rocephin and vancomycin per ID recommendations, GC and Chlamydia testing negative, patient threatened to leave AMA but eventually discharged on doxycycline 100 mg twice daily x1 month which he claims compliance to, presented with persistent pain and swelling of his left knee and ankle and worsening pain of left hip and low back for which he was seen in OSH in [place] 3 times for pain control and eventually presented to [facility] ED.   Assessment & Plan:   Principal Problem:   Septic arthritis (HCC) Active Problems:   SIRS (systemic inflammatory response syndrome) (HCC)   Polyarthritis   History of gonorrhea   Tobacco abuse   Polyarticular septic arthritis of left knee and ankle: Unclear etiology.  Differentials include infectious versus noninfectious etiology.  Recent evaluation including joint aspiration, I&D cultures negative including GC and Chlamydia testing.  Despite empiric IV antibiotics in hospital followed by oral doxycycline as outpatient, worsening symptoms.  Blood cultures x2: Negative to date.  Left knee x-ray without acute findings.  Left ankle x-ray shows soft tissue swelling around the malleoli but no acute findings.  Orthopedics input 6/28 appreciated and no surgical intervention planned.  Empirically started on IV ceftriaxone and vancomycin.  ID consulted and have narrowed to IV ceftriaxone to cover for possible disseminated GC  infection.  Patient recently treated for gonococcal infection of GU tract.  Left hip pain & low back pain:?  Related to asymmetric weightbearing and use of crutches due to his left knee and ankle pain.  Improved.  X-ray of left hip and MRI of left hip without acute findings.  Polysubstance abuse (tobacco and THC): Cessation counseled.  Nicotine patch.   DVT prophylaxis: SCDs Code Status: Full Family Communication: Cussed in detail with patient's fianc at bedside. Disposition: DC home pending clinical improvement.   Consultants:  Orthopedics Infectious disease  Procedures:  None  Antimicrobials:  IV vancomycin-discontinued. IV ceftriaxone  Subjective: Left hip and low back pain much improved.  Ongoing left knee and ankle pain and swelling.  No fever or chills.  ROS: As above.  Objective:  Vitals:   12/17/17 1501 12/17/17 2009 12/18/17 0423 12/18/17 1353  BP: 119/70 110/68 112/74 109/61  Pulse: 97 66 78 77  Resp: 16 16 18 18   Temp: 98.8 F (37.1 C) 98.7 F (37.1 C) 98.8 F (37.1 C) 98.1 F (36.7 C)  TempSrc: Oral Oral Oral Oral  SpO2: 99% 99% 99% 99%    Examination:  General exam: Pleasant young male, well-built and nourished lying comfortably supine in bed. Respiratory system: Clear to auscultation. Respiratory effort normal. Cardiovascular system: S1 & S2 heard, RRR. No JVD, murmurs, rubs, gallops or clicks. No pedal edema. Gastrointestinal system: Abdomen is nondistended, soft and nontender. No organomegaly or masses felt. Normal bowel sounds heard. Central nervous system: Alert and oriented. No focal neurological deficits. Extremities: Symmetric 5 x 5 power.  Left knee and ankle with moderate swelling, increased warmth, mild tenderness and painful range of movements. Skin: No rashes, lesions or ulcers  Psychiatry: Judgement and insight appear normal. Mood & affect appropriate.     Data Reviewed: I have personally reviewed following labs and imaging  studies  CBC: Recent Labs  Lab 12/17/17 0219 12/18/17 0414  WBC 10.8* 9.1  NEUTROABS 7.3  --   HGB 13.0 12.1*  HCT 38.9* 37.1*  MCV 89.6 90.9  PLT 476* 442*   Basic Metabolic Panel: Recent Labs  Lab 12/17/17 0219 12/18/17 0414  NA 136 138  K 4.0 4.2  CL 102 103  CO2 24 27  GLUCOSE 112* 105*  BUN 8 6  CREATININE 0.77 0.77  CALCIUM 9.3 9.4   Liver Function Tests: Recent Labs  Lab 12/17/17 0219  AST 19  ALT 21  ALKPHOS 60  BILITOT 0.7  PROT 7.2  ALBUMIN 3.1*     Recent Results (from the past 240 hour(s))  Blood culture (routine x 2)     Status: None (Preliminary result)   Collection Time: 12/17/17  2:15 AM  Result Value Ref Range Status   Specimen Description BLOOD RIGHT ANTECUBITAL  Final   Special Requests   Final    BOTTLES DRAWN AEROBIC AND ANAEROBIC Blood Culture adequate volume   Culture   Final    NO GROWTH 1 DAY Performed at [facility], [address] [place], [place] [address]    Report Status PENDING  Incomplete  Culture, blood (Routine X 2) w Reflex to ID Panel     Status: None (Preliminary result)   Collection Time: 12/17/17  2:20 AM  Result Value Ref Range Status   Specimen Description BLOOD LEFT ANTECUBITAL  Final   Special Requests   Final    BOTTLES DRAWN AEROBIC AND ANAEROBIC Blood Culture adequate volume   Culture   Final    NO GROWTH 1 DAY Performed at [facility], [address] [place], [place] [address]    Report Status PENDING  Incomplete         Radiology Studies: Dg Knee 2 Views Left  Result Date: 12/17/2017 CLINICAL DATA:  Septic arthritis follow-up. EXAM: LEFT KNEE - 1-2 VIEW COMPARISON:  12/07/2017 FINDINGS: No evidence of acute fracture or joint dislocation. Interval decrease in suprapatellar joint effusion. No evidence of arthropathy or other focal bone abnormality. Soft tissues are unremarkable. IMPRESSION: No acute fracture, suspicious osseous lesion or bone destruction. Near complete resolution of  suprapatellar joint effusion. Electronically Signed   By: Tollie Ethavid  Kwon M.D.   On: 12/17/2017 03:20   Dg Ankle Complete Left  Result Date: 12/17/2017 CLINICAL DATA:  Septic arthritis EXAM: LEFT ANKLE COMPLETE - 3+ VIEW COMPARISON:  12/07/2017 FINDINGS: Stable soft tissue swelling about the malleoli. Interval decrease in ankle joint effusion. No frank bone destruction or fracture. IMPRESSION: Mild-to-moderate soft tissue swelling about the malleoli persists. Interval decrease in tibiotalar joint effusion however is noted. No frank bone destruction is seen. Electronically Signed   By: Tollie Ethavid  Kwon M.D.   On: 12/17/2017 03:25   Mr Hip Left W Wo Contrast  Result Date: 12/17/2017 CLINICAL DATA:  Left hip pain. Clinical concern for septic arthritis. EXAM: MRI OF THE LEFT HIP WITHOUT AND WITH CONTRAST TECHNIQUE: Multiplanar, multisequence MR imaging was performed both before and after administration of intravenous contrast. CONTRAST:  15mL MULTIHANCE GADOBENATE DIMEGLUMINE 529 MG/ML IV SOLN COMPARISON:  None. FINDINGS: Bones: No hip fracture, dislocation or avascular necrosis. No periosteal reaction or bone destruction. No aggressive osseous lesion. Normal sacrum and sacroiliac joints. No SI joint widening or erosive  changes. Articular cartilage and labrum Articular cartilage:  No chondral defect. Labrum: Grossly intact, but evaluation is limited by lack of intraarticular fluid. Joint or bursal effusion Joint effusion: No hip joint effusion or synovitis. No SI joint effusion. Bursae:  No bursa formation. Muscles and tendons Flexors: Normal. Extensors: Normal. Abductors: Normal. Adductors: Normal. Gluteals: Normal. Hamstrings: Normal. Other findings Miscellaneous: No pelvic free fluid. No fluid collection or hematoma. No inguinal hernia. Mildly enlarged left inguinal lymph node measuring 12 mm. IMPRESSION: 1. No evidence of septic arthritis of the left hip. 2. No left hip fracture, dislocation or avascular necrosis.  Electronically Signed   By: Elige Ko   On: 12/17/2017 11:55   Dg Hip Unilat W Or Wo Pelvis 2-3 Views Left  Result Date: 12/17/2017 CLINICAL DATA:  Septic arthritis follow-up EXAM: DG HIP (WITH OR WITHOUT PELVIS) 2-3V LEFT COMPARISON:  None. FINDINGS: There is no evidence of hip fracture or dislocation. Hip joints are maintained without soft tissue swelling to suggest joint effusion. No frank bone destruction. There is no evidence of arthropathy or other focal bone abnormality. IMPRESSION: Negative. Electronically Signed   By: Tollie Eth M.D.   On: 12/17/2017 03:23        Scheduled Meds: . nicotine  14 mg Transdermal Daily   Continuous Infusions: . sodium chloride 1,000 mL (12/18/17 0036)  . cefTRIAXone (ROCEPHIN)  IV 2 g (12/18/17 1346)     LOS: 1 day     Marcellus Scott, MD, FACP, [facility]. [facility] Pager [phone]  If 7PM-7AM, please contact night-coverage [email or web address] Password [email or web address] 12/18/2017, 2:40 PM

## 2017-12-19 ENCOUNTER — Inpatient Hospital Stay (HOSPITAL_COMMUNITY): Payer: Managed Care, Other (non HMO)

## 2017-12-19 DIAGNOSIS — M00862 Arthritis due to other bacteria, left knee: Secondary | ICD-10-CM

## 2017-12-19 LAB — SEDIMENTATION RATE: Sed Rate: 97 mm/hr — ABNORMAL HIGH (ref 0–16)

## 2017-12-19 LAB — C-REACTIVE PROTEIN: CRP: 18.5 mg/dL — ABNORMAL HIGH (ref <1.0)

## 2017-12-19 LAB — RPR: RPR: NONREACTIVE

## 2017-12-19 MED ORDER — GADOBENATE DIMEGLUMINE 529 MG/ML IV SOLN
15.0000 mL | Freq: Once | INTRAVENOUS | Status: AC
  Administered 2017-12-19: 15 mL via INTRAVENOUS

## 2017-12-19 NOTE — Plan of Care (Signed)
  Problem: Nutrition: Goal: Adequate nutrition will be maintained Outcome: Progressing   Problem: Elimination: Goal: Will not experience complications related to bowel motility Outcome: Progressing   Problem: Safety: Goal: Ability to remain free from injury will improve Outcome: Progressing   Problem: Pain Managment: Goal: General experience of comfort will improve Outcome: Progressing   

## 2017-12-19 NOTE — Progress Notes (Signed)
Subjective: Having some more pain in his left foot with some erythema   Antibiotics:  Anti-infectives (From admission, onward)   Start     Dose/Rate Route Frequency Ordered Stop   12/18/17 1400  ceFAZolin (ANCEF) IVPB 2g/100 mL premix  Status:  Discontinued     2 g 200 mL/hr over 30 Minutes Intravenous Every 8 hours 12/18/17 1013 12/18/17 1221   12/18/17 1230  cefTRIAXone (ROCEPHIN) 2 g in sodium chloride 0.9 % 100 mL IVPB     2 g 200 mL/hr over 30 Minutes Intravenous Every 24 hours 12/18/17 1221     12/18/17 0000  cefTRIAXone (ROCEPHIN) 1 g in sodium chloride 0.9 % 100 mL IVPB  Status:  Discontinued     1 g 200 mL/hr over 30 Minutes Intravenous Every 24 hours 12/17/17 0723 12/18/17 0918   12/17/17 1000  vancomycin (VANCOCIN) IVPB 1000 mg/200 mL premix  Status:  Discontinued     1,000 mg 200 mL/hr over 60 Minutes Intravenous Every 8 hours 12/17/17 0747 12/18/17 0918   12/17/17 0215  cefTRIAXone (ROCEPHIN) 1 g in sodium chloride 0.9 % 100 mL IVPB     1 g 200 mL/hr over 30 Minutes Intravenous  Once 12/17/17 0204 12/17/17 0324   12/17/17 0215  vancomycin (VANCOCIN) 1,500 mg in sodium chloride 0.9 % 500 mL IVPB     1,500 mg 250 mL/hr over 120 Minutes Intravenous  Once 12/17/17 0208 12/17/17 0526      Medications: Scheduled Meds: . nicotine  14 mg Transdermal Daily   Continuous Infusions: . cefTRIAXone (ROCEPHIN)  IV 2 g (12/19/17 1224)   PRN Meds:.acetaminophen **OR** acetaminophen, bisacodyl, HYDROcodone-acetaminophen, morphine injection, ondansetron, senna-docusate    Objective: Weight change:   Intake/Output Summary (Last 24 hours) at 12/19/2017 1526 Last data filed at 12/19/2017 0900 Gross per 24 hour  Intake 600 ml  Output -  Net 600 ml   Blood pressure 118/68, pulse 80, temperature 99.5 F (37.5 C), temperature source Oral, resp. rate 14, SpO2 98 %. Temp:  [98.9 F (37.2 C)-99.5 F (37.5 C)] 99.5 F (37.5 C) (06/30 0342) Pulse Rate:  [80-84] 80  (06/30 0342) Resp:  [14] 14 (06/30 0342) BP: (98-118)/(58-68) 118/68 (06/30 0342) SpO2:  [98 %] 98 % (06/30 0342)  Physical Exam:  General alert and oriented x3  Cardiovascular regular rate and rhythm  Pulmonary no wheezes or respiratory distress  GI nondistended  Extremities left knee with edema effusion and some tenderness left ankle also edematous with further erythema more distally near toes  Right foot is without erythema but with some tenderness over second third and fourth digits as well as more proximal  CBC:    BMET Recent Labs    12/17/17 0219 12/18/17 0414  NA 136 138  K 4.0 4.2  CL 102 103  CO2 24 27  GLUCOSE 112* 105*  BUN 8 6  CREATININE 0.77 0.77  CALCIUM 9.3 9.4     Liver Panel  Recent Labs    12/17/17 0219  PROT 7.2  ALBUMIN 3.1*  AST 19  ALT 21  ALKPHOS 60  BILITOT 0.7       Sedimentation Rate Recent Labs    12/19/17 0449  ESRSEDRATE 97*   C-Reactive Protein Recent Labs    12/19/17 0449  CRP 18.5*    Micro Results: Recent Results (from the past 720 hour(s))  Blood Culture (routine x 2)     Status: None   Collection Time: 12/06/17  11:41 PM  Result Value Ref Range Status   Specimen Description BLOOD RIGHT ANTECUBITAL  Final   Special Requests   Final    BOTTLES DRAWN AEROBIC AND ANAEROBIC Blood Culture results may not be optimal due to an inadequate volume of blood received in culture bottles   Culture   Final    NO GROWTH 5 DAYS Performed at [facility], [address], [place], [place] [address]    Report Status 12/12/2017 FINAL  Final  Blood Culture (routine x 2)     Status: None   Collection Time: 12/07/17 12:02 AM  Result Value Ref Range Status   Specimen Description BLOOD LEFT ANTECUBITAL  Final   Special Requests   Final    BOTTLES DRAWN AEROBIC AND ANAEROBIC Blood Culture adequate volume   Culture   Final    NO GROWTH 5 DAYS Performed at [facility], [address] [place], [place]  [address]    Report Status 12/12/2017 FINAL  Final  Culture, body fluid-bottle     Status: None   Collection Time: 12/07/17 12:49 AM  Result Value Ref Range Status   Specimen Description JOINT FLUID LEFT KNEE  Final   Special Requests NONE  Final   Culture   Final    NO GROWTH 5 DAYS Performed at [facility], [address], [place], [place] [address]    Report Status 12/12/2017 FINAL  Final  Gram stain     Status: None   Collection Time: 12/07/17 12:49 AM  Result Value Ref Range Status   Specimen Description JOINT FLUID LEFT KNEE  Final   Special Requests NONE  Final   Gram Stain   Final    MODERATE WBC PRESENT, PREDOMINANTLY PMN NO ORGANISMS SEEN Performed at [facility], [address] [place], [place] [address]    Report Status 12/07/2017 FINAL  Final  MRSA PCR Screening     Status: None   Collection Time: 12/07/17 10:00 AM  Result Value Ref Range Status   MRSA by PCR NEGATIVE NEGATIVE Final    Comment:        The GeneXpert MRSA Assay (FDA approved for NASAL specimens only), is one component of a comprehensive MRSA colonization surveillance program. It is not intended to diagnose MRSA infection nor to guide or monitor treatment for MRSA infections. Performed at [facility], [address] [place], [place] [address]   Blood culture (routine x 2)     Status: None (Preliminary result)   Collection Time: 12/17/17  2:15 AM  Result Value Ref Range Status   Specimen Description BLOOD RIGHT ANTECUBITAL  Final   Special Requests   Final    BOTTLES DRAWN AEROBIC AND ANAEROBIC Blood Culture adequate volume   Culture   Final    NO GROWTH 2 DAYS Performed at [facility], [address], [place], [place] [address]    Report Status PENDING  Incomplete  Culture, blood (Routine X 2) w Reflex to ID Panel     Status: None (Preliminary result)   Collection Time: 12/17/17  2:20 AM  Result Value Ref Range Status   Specimen Description BLOOD LEFT  ANTECUBITAL  Final   Special Requests   Final    BOTTLES DRAWN AEROBIC AND ANAEROBIC Blood Culture adequate volume   Culture   Final    NO GROWTH 2 DAYS Performed at [facility], [address], [place], [place] [address]  Report Status PENDING  Incomplete    Studies/Results: Dg Foot Complete Right  Result Date: 12/18/2017 CLINICAL DATA:  Pain along second third and fourth MTP joints for 3 days. History of recent cellulitis. No trauma. EXAM: RIGHT FOOT COMPLETE - 3+ VIEW COMPARISON:  None. FINDINGS: No fractures or dislocations. No soft tissue gas identified. No bony erosion. IMPRESSION: No acute abnormality noted. Electronically Signed   By: Gerome Sam III M.D   On: 12/18/2017 14:41      Assessment/Plan:  INTERVAL HISTORY: MRI is pending   Principal Problem:   Septic arthritis (HCC) Active Problems:   SIRS (systemic inflammatory response syndrome) (HCC)   Polyarthritis   History of gonorrhea   Tobacco abuse    Michael Welch is a 22 y.o. male with prior history of gonorrhea 1 month before he developed a polyarticular septic arthritis status post I&D of his left knee and ankle by orthopedic surgery who then threatened to leave AGAINST MEDICAL ADVICE and was sent home on doxycycline but who failed to improve and returned with worsening knee pain ankle pain in the left and now foot pain in the right.  1.  Polyarticular septic arthritis involving left knee and ankle: Continue ceftriaxone to cover for streptococcal species since these were have not been covered well by doxycycline to cover for possible gonorrhea though I am more skeptical that he would have this after he was treated with appropriate antibiotics again according to his report though.  I agree with imaging the left ankle and foot where he has increasing erythema and an MRI is pending  2.  Pain in right toes and foot I am getting an MRI of that region as well x-ray was unrevealing.  Dr. Orvan Falconer to take  over the service tomorrow.   LOS: 2 days   Acey Lav 12/19/2017, 3:26 PM

## 2017-12-19 NOTE — Plan of Care (Signed)
  Problem: Activity: Goal: Risk for activity intolerance will decrease Outcome: Progressing   Problem: Nutrition: Goal: Adequate nutrition will be maintained Outcome: Progressing   Problem: Pain Managment: Goal: General experience of comfort will improve Outcome: Progressing   Problem: Safety: Goal: Ability to remain free from injury will improve Outcome: Progressing   Problem: Skin Integrity: Goal: Risk for impaired skin integrity will decrease Outcome: Progressing   

## 2017-12-19 NOTE — Progress Notes (Signed)
PROGRESS NOTE   Michael Welch  JGG:[id number]    DOB: May 10, 1996    DOA: 12/16/2017  PCP: Manon Hilding, MD   I have briefly reviewed patients previous medical records in [facility].  Brief Narrative:  22 year old male with history of substance abuse, recent hospitalization 12/06/2017-12/08/2017 for left ankle and knee pain, suspected septic arthritis, underwent joint aspiration followed by arthroscopic knee irrigation and debridement by orthopedics on 12/07/2017, all cultures negative, initially treated with Zosyn and vancomycin then transitioned to Rocephin and vancomycin per ID recommendations, GC and Chlamydia testing negative, patient threatened to leave AMA but eventually discharged on doxycycline 100 mg twice daily x1 month which he claims compliance to, presented with persistent pain and swelling of his left knee and ankle and worsening pain of left hip and low back for which he was seen in OSH in [place] 3 times for pain control and eventually presented to [facility] ED. ID consulted and treating for possible disseminated GC infection.  Orthopedics consulting.   Assessment & Plan:   Principal Problem:   Septic arthritis ([place]) Active Problems:   SIRS (systemic inflammatory response syndrome) (HCC)   Polyarthritis   History of gonorrhea   Tobacco abuse   Polyarticular septic arthritis of left knee and ankle: Unclear etiology.  Differentials include infectious versus noninfectious etiology.  Recent evaluation including joint aspiration, I&D cultures negative including GC and Chlamydia testing.  Despite empiric IV antibiotics in hospital followed by oral doxycycline as outpatient, worsening symptoms.  Blood cultures x2: Negative to date.  Left knee x-ray without acute findings.  Left ankle x-ray shows soft tissue swelling around the malleoli but no acute findings.  Orthopedics follow-up 6/30 appreciated and no surgical intervention planned and have requested MRI of left ankle.  Empirically  started on IV ceftriaxone and vancomycin.  ID consulted and have narrowed to IV ceftriaxone to cover for possible disseminated GC infection.  Patient recently treated approximately a month ago for gonococcal infection of GU tract.  Pain better controlled.  CRP 18.5.  ESR 97.  Left hip pain & low back pain:?  Related to asymmetric weightbearing and use of crutches due to his left knee and ankle pain.  Improved.  X-ray of left hip and MRI of left hip without acute findings.  Polysubstance abuse (tobacco and THC): Cessation counseled.  Nicotine patch.  Tattoos: ID checking hepatitis panel.   DVT prophylaxis: SCDs Code Status: Full Family Communication: Discussed in detail with patient's fianc at bedside.  Updated care and answered questions. Disposition: DC home pending clinical improvement.   Consultants:  Orthopedics Infectious disease  Procedures:  None  Antimicrobials:  IV vancomycin-discontinued. IV ceftriaxone  Subjective: No low back or left hip pain reported.  Left knee and left ankle pain and swelling about "15% improved" compared to admission.  Denies any other complaints.  ROS: As above.  Objective:  Vitals:   12/18/17 0423 12/18/17 1353 12/18/17 2023 12/19/17 0342  BP: 112/74 109/61 (!) 98/58 118/68  Pulse: 78 77 84 80  Resp: '18 18 14 14  '$ Temp: 98.8 F (37.1 C) 98.1 F (36.7 C) 98.9 F (37.2 C) 99.5 F (37.5 C)  TempSrc: Oral Oral Oral Oral  SpO2: 99% 99% 98% 98%    Examination: No significant change in exam findings compared to 6/29.  General exam: Pleasant young male, well-built and nourished lying comfortably supine in bed. Respiratory system: Clear to auscultation. Respiratory effort normal. Cardiovascular system: S1 & S2 heard, RRR. No JVD, murmurs, rubs, gallops  or clicks. No pedal edema. Gastrointestinal system: Abdomen is nondistended, soft and nontender. No organomegaly or masses felt. Normal bowel sounds heard. Central nervous system: Alert and  oriented. No focal neurological deficits. Extremities: Symmetric 5 x 5 power.  Left knee and ankle with moderate swelling, increased warmth, mild tenderness and painful range of movements.  Healing scars from previous I&D sites on left knee and ankle.  No erythema of the left knee.  Minimal patchy erythema of left ankle. Skin: No rashes, lesions or ulcers Psychiatry: Judgement and insight appear normal. Mood & affect appropriate.     Data Reviewed: I have personally reviewed following labs and imaging studies  CBC: Recent Labs  Lab 12/17/17 0219 12/18/17 0414  WBC 10.8* 9.1  NEUTROABS 7.3  --   HGB 13.0 12.1*  HCT 38.9* 37.1*  MCV 89.6 90.9  PLT 476* 258*   Basic Metabolic Panel: Recent Labs  Lab 12/17/17 0219 12/18/17 0414  NA 136 138  K 4.0 4.2  CL 102 103  CO2 24 27  GLUCOSE 112* 105*  BUN 8 6  CREATININE 0.77 0.77  CALCIUM 9.3 9.4   Liver Function Tests: Recent Labs  Lab 12/17/17 0219  AST 19  ALT 21  ALKPHOS 60  BILITOT 0.7  PROT 7.2  ALBUMIN 3.1*     Recent Results (from the past 240 hour(s))  Blood culture (routine x 2)     Status: None (Preliminary result)   Collection Time: 12/17/17  2:15 AM  Result Value Ref Range Status   Specimen Description BLOOD RIGHT ANTECUBITAL  Final   Special Requests   Final    BOTTLES DRAWN AEROBIC AND ANAEROBIC Blood Culture adequate volume   Culture   Final    NO GROWTH 1 DAY Performed at [facility], [address], [place], [place] [address]    Report Status PENDING  Incomplete  Culture, blood (Routine X 2) w Reflex to ID Panel     Status: None (Preliminary result)   Collection Time: 12/17/17  2:20 AM  Result Value Ref Range Status   Specimen Description BLOOD LEFT ANTECUBITAL  Final   Special Requests   Final    BOTTLES DRAWN AEROBIC AND ANAEROBIC Blood Culture adequate volume   Culture   Final    NO GROWTH 1 DAY Performed at [facility], [place] [address] [place], [place] [address]    Report  Status PENDING  Incomplete         Radiology Studies: Dg Foot Complete Right  Result Date: 12/18/2017 CLINICAL DATA:  Pain along second third and fourth MTP joints for 3 days. History of recent cellulitis. No trauma. EXAM: RIGHT FOOT COMPLETE - 3+ VIEW COMPARISON:  None. FINDINGS: No fractures or dislocations. No soft tissue gas identified. No bony erosion. IMPRESSION: No acute abnormality noted. Electronically Signed   By: Dorise Bullion III M.D   On: 12/18/2017 14:41        Scheduled Meds: . nicotine  14 mg Transdermal Daily   Continuous Infusions: . cefTRIAXone (ROCEPHIN)  IV 2 g (12/19/17 1224)     LOS: 2 days     Vernell Leep, MD, FACP, [facility]. [facility] Pager [phone]  If 7PM-7AM, please contact night-coverage [email or web address] Password [email or web address] 12/19/2017, 1:48 PM

## 2017-12-19 NOTE — Progress Notes (Addendum)
Orthopedic Trauma Service Progress Note   Patient ID: Michael Welch MRN: [id number] DOB/AGE: [birth year] 22 y.o.  Subjective:  Patient states that he is feeling better this morning  Fevers or chills  No chest pain or shortness of breath  Primary pain is in his ankle and foot  Patient denies IV drug use or any other drug use for that matter Smokes about half pack a day  ROS As above Objective:   VITALS:   Vitals:   12/18/17 0423 12/18/17 1353 12/18/17 2023 12/19/17 0342  BP: 112/74 109/61 (!) 98/58 118/68  Pulse: 78 77 84 80  Resp: 18 18 14 14   Temp: 98.8 F (37.1 C) 98.1 F (36.7 C) 98.9 F (37.2 C) 99.5 F (37.5 C)  TempSrc: Oral Oral Oral Oral  SpO2: 99% 99% 98% 98%    Estimated body mass index is 22.43 kg/m as calculated from the following:   Height as of 12/07/17: 6' 0.99" (1.854 m).   Weight as of 12/07/17: 77.1 kg (170 lb).   Intake/Output      06/29 0701 - 06/30 0700 06/30 0701 - 07/01 0700   P.O. 702 360   I.V.     IV Piggyback 100    Total Intake 802 360   Urine 625    Total Output 625    Net +177 +360          LABS  Results for orders placed or performed during the hospital encounter of 12/16/17 (from the past 24 hour(s))  Sedimentation rate     Status: Abnormal   Collection Time: 12/19/17  4:49 AM  Result Value Ref Range   Sed Rate 97 (H) 0 - 16 mm/hr  C-reactive protein     Status: Abnormal   Collection Time: 12/19/17  4:49 AM  Result Value Ref Range   CRP 18.5 (H) <1.0 mg/dL     PHYSICAL EXAM:   Gen: Resting comfortably in bed, does not appear septic Ext:       Left lower extremity  Portals at his left knee and left ankle are well-healed from his previous irrigation and debridements about 10 days ago  No real perceivable knee effusion.  Knee is not red, he does have pain with manipulation of his knee but I think that he has been keeping it in a relatively flexed state and not moving it at all.  His pain is  not out of proportion to exam  Mild swelling about his ankle.  There is no significant erythema he does  what appears to be scattered areas of petechiae about his ankle.  No drainage from his portal sites.  Tolerates gentle range of motion of his ankle without  significant pain  Moderate erythema over his first MTP however he does not have significant pain with passive motion of his first MTP joint  No significant swelling about his foot  Distal motor and sensory functions are grossly intact  No pain with passive hip flexion or extension.  No pain with resisted hip extension.  No pain with internal and external rotation of his hip   Assessment/Plan:     Principal Problem:   Septic arthritis (HCC) Active Problems:   SIRS (systemic inflammatory response syndrome) (HCC)   Polyarthritis   History of gonorrhea   Tobacco abuse   Anti-infectives (From admission, onward)   Start     Dose/Rate Route Frequency Ordered Stop   12/18/17 1400  ceFAZolin (ANCEF) IVPB 2g/100 mL premix  Status:  Discontinued  2 g 200 mL/hr over 30 Minutes Intravenous Every 8 hours 12/18/17 1013 12/18/17 1221   12/18/17 1230  cefTRIAXone (ROCEPHIN) 2 g in sodium chloride 0.9 % 100 mL IVPB     2 g 200 mL/hr over 30 Minutes Intravenous Every 24 hours 12/18/17 1221     12/18/17 0000  cefTRIAXone (ROCEPHIN) 1 g in sodium chloride 0.9 % 100 mL IVPB  Status:  Discontinued     1 g 200 mL/hr over 30 Minutes Intravenous Every 24 hours 12/17/17 0723 12/18/17 0918   12/17/17 1000  vancomycin (VANCOCIN) IVPB 1000 mg/200 mL premix  Status:  Discontinued     1,000 mg 200 mL/hr over 60 Minutes Intravenous Every 8 hours 12/17/17 0747 12/18/17 0918   12/17/17 0215  cefTRIAXone (ROCEPHIN) 1 g in sodium chloride 0.9 % 100 mL IVPB     1 g 200 mL/hr over 30 Minutes Intravenous  Once 12/17/17 0204 12/17/17 0324   12/17/17 0215  vancomycin (VANCOCIN) 1,500 mg in sodium chloride 0.9 % 500 mL IVPB     1,500 mg 250 mL/hr over 120  Minutes Intravenous  Once 12/17/17 0208 12/17/17 3975    .  22 year old male with history of left knee and left ankle septic arthritis status post I&D approximately 10 days ago by Dr. Eulah Pont.  Readmitted for increasing pain and inability to bear weight  -s/p I&D L knee and ankle for septic arthritis, Left AMA before adequate treatment can be completed  His physical findings do not suggest the need for return to the OR at this time.  I do not appreciate any increased effusion at his knee or his ankle compared to previous exam or images  He does not have pain out of proportion with passive motion of his joint either.   Continue with antibiotics per infectious disease service  -Left foot/first MTP erythema  Again this looks petechial in nature as well, however given history  will order MRI of left foot and ankle.    - Pain management:  Continue per medical service and infectious disease service  - Dispo:  Dr. Eulah Pont to follow back up tomorrow or after MRI is completed   Okay to eat    Mearl Latin, PA-C [facility] [phone] ([phone] Traci Sermon (C) 12/19/2017, 10:35 AM

## 2017-12-20 ENCOUNTER — Inpatient Hospital Stay: Payer: Self-pay

## 2017-12-20 DIAGNOSIS — M01X9 Direct infection of multiple joints in infectious and parasitic diseases classified elsewhere: Secondary | ICD-10-CM

## 2017-12-20 DIAGNOSIS — Z888 Allergy status to other drugs, medicaments and biological substances status: Secondary | ICD-10-CM

## 2017-12-20 MED ORDER — VANCOMYCIN HCL IN DEXTROSE 1-5 GM/200ML-% IV SOLN
1000.0000 mg | Freq: Three times a day (TID) | INTRAVENOUS | Status: DC
  Administered 2017-12-20 – 2017-12-21 (×4): 1000 mg via INTRAVENOUS
  Filled 2017-12-20 (×5): qty 200

## 2017-12-20 MED ORDER — SODIUM CHLORIDE 0.9% FLUSH: 10.0000 mL | INTRAVENOUS | Status: DC | PRN

## 2017-12-20 MED ORDER — SODIUM CHLORIDE 0.9% FLUSH
10.0000 mL | Freq: Two times a day (BID) | INTRAVENOUS | Status: DC
  Administered 2017-12-20 – 2017-12-22 (×2): 10 mL

## 2017-12-20 NOTE — Progress Notes (Signed)
Peripherally Inserted Central Catheter/Midline Placement  The IV Nurse has discussed with the patient and/or persons authorized to consent for the patient, the purpose of this procedure and the potential benefits and risks involved with this procedure.  The benefits include less needle sticks, lab draws from the catheter, and the patient may be discharged home with the catheter. Risks include, but not limited to, infection, bleeding, blood clot (thrombus formation), and puncture of an artery; nerve damage and irregular heartbeat and possibility to perform a PICC exchange if needed/ordered by physician.  Alternatives to this procedure were also discussed.  Bard Power PICC patient education guide, fact sheet on infection prevention and patient information card has been provided to patient /or left at bedside.    PICC/Midline Placement Documentation  PICC Single Lumen 12/20/17 PICC Right Basilic 40 cm 0 cm (Active)  Indication for Insertion or Continuance of Line Home intravenous therapies (PICC only) 12/20/2017  8:40 PM  Exposed Catheter (cm) 0 cm 12/20/2017  8:40 PM  Site Assessment Clean;Dry;Intact 12/20/2017  8:40 PM  Line Status Blood return noted;Flushed;Saline locked 12/20/2017  8:40 PM  Dressing Type Transparent;Occlusive 12/20/2017  8:40 PM  Dressing Status Clean;Dry;Intact;Antimicrobial disc in place 12/20/2017  8:40 PM  Dressing Intervention New dressing 12/20/2017  8:40 PM  Dressing Change Due 12/27/17 12/20/2017  8:40 PM       Netta Corriganhomas, Shanyn Preisler L 12/20/2017, 8:55 PM

## 2017-12-20 NOTE — Care Management Note (Addendum)
Case Management Note  Patient Details  Name: Glendora Scoreristan Youtz MRN: [id number] Date of Birth: [birth year]  Subjective/Objective:   Septic arthritis of left knee, ankle, will need IV abx x 2 weeks                 Action/Plan: NCM spoke to pt and states he works full-time. Will need a note for work. [facility] Contacted [facility] RN IV Infusion Coordinator for possible dc tomorrow of Wed. Will need PICC line placed. States his parents and girlfriend will be available also to assist with giving IV abx at home.   Contacts Royetta CrochetMichael Saxton father # [phone] or mother, Payton Mccallumracy Edmonds # [phone]   Expected Discharge Date:                  Expected Discharge Plan:  Home w Home Health Services  In-House Referral:  NA  Discharge planning Services  CM Consult  Post Acute Care Choice:  Home Health Choice offered to:  Patient  DME Arranged:  N/A DME Agency:  NA  HH Arranged:  RN, IV Antibiotics HH Agency:  [facility]  Status of Service:  In process, will continue to follow  If discussed at Long Length of Stay Meetings, dates discussed:    Additional Comments:  Elliot CousinShavis, Mabrey Howland Ellen, RN 12/20/2017, 5:38 PM

## 2017-12-20 NOTE — Plan of Care (Signed)
  Problem: Activity: Goal: Risk for activity intolerance will decrease Outcome: Progressing   Problem: Elimination: Goal: Will not experience complications related to bowel motility Outcome: Progressing   Problem: Pain Managment: Goal: General experience of comfort will improve Outcome: Progressing   Problem: Pain Managment: Goal: General experience of comfort will improve Outcome: Progressing   Problem: Safety: Goal: Ability to remain free from injury will improve Outcome: Progressing

## 2017-12-20 NOTE — Progress Notes (Signed)
Pharmacy Antibiotic Note  Michael Welch is a 22 y.o. male admitted on 12/16/2017 with septic arthritis.  Pharmacy has been consulted for vancomycin dosing.  See past pharmacy notes for more details about this case (this Clinical research associatewriter has a note dated 6/28 with more information).  Patient to restart vancomycin and continue ceftriaxone- ID is planning on a 2 week course of both for culture negative septic arthritis   Plan: Vancomycin 1g IV q8h Will obtain trough before 4th dose and attempt to change to a q12h regimen to ease home administration Ceftriaxone 2g IV q24h Follow renal function and changes to above ID plans      Temp (24hrs), Avg:98.8 F (37.1 C), Min:98.6 F (37 C), Max:98.9 F (37.2 C)  Recent Labs  Lab 12/17/17 0219 12/17/17 0230 12/18/17 0414  WBC 10.8*  --  9.1  CREATININE 0.77  --  0.77  LATICACIDVEN  --  0.69  --     Estimated Creatinine Clearance: 159.3 mL/min (by C-G formula based on SCr of 0.77 mg/dL).    Allergies  Allergen Reactions  . Sulfa Antibiotics Anaphylaxis, Nausea And Vomiting and Swelling    Angioedema also  . Clindamycin/Lincomycin Itching    Pt's mother stated that Pt gets red man syndrome with clindamycin, would need benadryl if Pt is to get this med.  . Other Hives    (Sweet) green peas    Antimicrobials this admission: Vancomycin 6/28>>6/29; 7/1>> Cefazolin 6/29 x1 Ceftriaxone 6/29>>  Dose adjustments this admission: n/a  Microbiology results: Previous admission-  6/17 BCx: neg 6/18 L knee fluid: neg 6/18 GC/chlamydia: neg  6/28 BCx: ngtd   Thank you for allowing pharmacy to be a part of this patient's care.  Nishat Livingston D. Rachit Grim, PharmD, BCPS Clinical Pharmacist [phone] Please check AMION for all [facility] numbers 12/20/2017 10:10 AM

## 2017-12-20 NOTE — Progress Notes (Signed)
PROGRESS NOTE    Michael Welch  ZOX:[id number] DOB: 27-Oct-1995 DOA: 12/16/2017 PCP: Estanislado Pandy, MD     Brief Narrative:  Michael Welch is a 22 year old male with history of substance abuse, recent hospitalization 12/06/2017-12/08/2017 for left ankle and knee pain, suspected septic arthritis, underwent joint aspiration followed by arthroscopic knee irrigation and debridement by orthopedics on 12/07/2017, all cultures negative, initially treated with Zosyn and vancomycin then transitioned to Rocephin and vancomycin per ID recommendations, GC and Chlamydia testing negative, patient threatened to leave AMA but eventually discharged on doxycycline 100 mg twice daily x1 month which he claims compliance to, presented with persistent pain and swelling of his left knee and ankle and worsening pain of left hip and low back for which he was seen in OSH in [place] 3 times for pain control and eventually presented to [facility] ED. ID and orthopedic surgery were consulted.    New events last 24 hours / Subjective: No acute events. Admits to persistent left knee and left ankle pain, not worsening.   Assessment & Plan:   Principal Problem:   Septic arthritis (HCC) Active Problems:   SIRS (systemic inflammatory response syndrome) (HCC)   Polyarthritis   History of gonorrhea   Tobacco abuse   Polyarticular septic arthritis of left knee and ankle: Remains culture negative. Recent evaluation including joint aspiration, I&D cultures negative including GC and Chlamydia testing.  Despite empiric IV antibiotics in hospital followed by oral doxycycline as outpatient, worsening symptoms.  Blood cultures x2: Negative to date.  Left knee x-ray without acute findings.  Left ankle x-ray shows soft tissue swelling around the malleoli but no acute findings.  Orthopedics follow-up 6/30 appreciated and no surgical intervention planned. MRI left ankleshowed tenosynovitis, edema.  ID planning for 2 weeks of IV vanco/ceftriaxone.  PICC ordered.   Left hip pain & low back pain: ?Related to asymmetric weightbearing and use of crutches due to his left knee and ankle pain.  Improved.  X-ray of left hip and MRI of left hip without acute findings.  Polysubstance abuse (tobacco and THC): Cessation counseled.  Nicotine patch.  Tattoos: ID checking hepatitis panel, pending   DVT prophylaxis: SCD Code Status: Full Family Communication: Family asleep at bedside Disposition Plan: Pending clinical improvement   Consultants:   ID  Orthopedic surgery   Procedures:   None   Antimicrobials:  Anti-infectives (From admission, onward)   Start     Dose/Rate Route Frequency Ordered Stop   12/20/17 1030  vancomycin (VANCOCIN) IVPB 1000 mg/200 mL premix     1,000 mg 200 mL/hr over 60 Minutes Intravenous Every 8 hours 12/20/17 1009     12/18/17 1400  ceFAZolin (ANCEF) IVPB 2g/100 mL premix  Status:  Discontinued     2 g 200 mL/hr over 30 Minutes Intravenous Every 8 hours 12/18/17 1013 12/18/17 1221   12/18/17 1230  cefTRIAXone (ROCEPHIN) 2 g in sodium chloride 0.9 % 100 mL IVPB     2 g 200 mL/hr over 30 Minutes Intravenous Every 24 hours 12/18/17 1221     12/18/17 0000  cefTRIAXone (ROCEPHIN) 1 g in sodium chloride 0.9 % 100 mL IVPB  Status:  Discontinued     1 g 200 mL/hr over 30 Minutes Intravenous Every 24 hours 12/17/17 0723 12/18/17 0918   12/17/17 1000  vancomycin (VANCOCIN) IVPB 1000 mg/200 mL premix  Status:  Discontinued     1,000 mg 200 mL/hr over 60 Minutes Intravenous Every 8 hours 12/17/17 0747 12/18/17 8119  12/17/17 0215  cefTRIAXone (ROCEPHIN) 1 g in sodium chloride 0.9 % 100 mL IVPB     1 g 200 mL/hr over 30 Minutes Intravenous  Once 12/17/17 0204 12/17/17 0324   12/17/17 0215  vancomycin (VANCOCIN) 1,500 mg in sodium chloride 0.9 % 500 mL IVPB     1,500 mg 250 mL/hr over 120 Minutes Intravenous  Once 12/17/17 0208 12/17/17 0526       Objective: Vitals:   12/18/17 2023 12/19/17 0342 12/19/17  2056 12/20/17 0432  BP: (!) 98/58 118/68 112/66 112/62  Pulse: 84 80 88 90  Resp: 14 14 17 17   Temp: 98.9 F (37.2 C) 99.5 F (37.5 C) 98.9 F (37.2 C) 98.6 F (37 C)  TempSrc: Oral Oral Oral Oral  SpO2: 98% 98% 100% 100%    Intake/Output Summary (Last 24 hours) at 12/20/2017 1142 Last data filed at 12/19/2017 1600 Gross per 24 hour  Intake 200 ml  Output -  Net 200 ml   There were no vitals filed for this visit.  Examination:  General exam: Appears calm and comfortable  Respiratory system: Clear to auscultation. Respiratory effort normal. Cardiovascular system: S1 & S2 heard, RRR. No JVD, murmurs, rubs, gallops or clicks. No pedal edema. Gastrointestinal system: Abdomen is nondistended, soft and nontender. No organomegaly or masses felt. Normal bowel sounds heard. Central nervous system: Alert and oriented. No focal neurological deficits. Extremities: Left ankle with edema with some erythema of left MTP, left knee without significant edema, no erythema  Skin: No rashes, lesions or ulcers Psychiatry: Judgement and insight appear normal. Mood & affect appropriate.   Data Reviewed: I have personally reviewed following labs and imaging studies  CBC: Recent Labs  Lab 12/17/17 0219 12/18/17 0414  WBC 10.8* 9.1  NEUTROABS 7.3  --   HGB 13.0 12.1*  HCT 38.9* 37.1*  MCV 89.6 90.9  PLT 476* 442*   Basic Metabolic Panel: Recent Labs  Lab 12/17/17 0219 12/18/17 0414  NA 136 138  K 4.0 4.2  CL 102 103  CO2 24 27  GLUCOSE 112* 105*  BUN 8 6  CREATININE 0.77 0.77  CALCIUM 9.3 9.4   GFR: Estimated Creatinine Clearance: 159.3 mL/min (by C-G formula based on SCr of 0.77 mg/dL). Liver Function Tests: Recent Labs  Lab 12/17/17 0219  AST 19  ALT 21  ALKPHOS 60  BILITOT 0.7  PROT 7.2  ALBUMIN 3.1*   No results for input(s): LIPASE, AMYLASE in the last 168 hours. No results for input(s): AMMONIA in the last 168 hours. Coagulation Profile: No results for input(s):  INR, PROTIME in the last 168 hours. Cardiac Enzymes: No results for input(s): CKTOTAL, CKMB, CKMBINDEX, TROPONINI in the last 168 hours. BNP (last 3 results) No results for input(s): PROBNP in the last 8760 hours. HbA1C: No results for input(s): HGBA1C in the last 72 hours. CBG: No results for input(s): GLUCAP in the last 168 hours. Lipid Profile: No results for input(s): CHOL, HDL, LDLCALC, TRIG, CHOLHDL, LDLDIRECT in the last 72 hours. Thyroid Function Tests: No results for input(s): TSH, T4TOTAL, FREET4, T3FREE, THYROIDAB in the last 72 hours. Anemia Panel: No results for input(s): VITAMINB12, FOLATE, FERRITIN, TIBC, IRON, RETICCTPCT in the last 72 hours. Sepsis Labs: Recent Labs  Lab 12/17/17 0230  LATICACIDVEN 0.69    Recent Results (from the past 240 hour(s))  Blood culture (routine x 2)     Status: None (Preliminary result)   Collection Time: 12/17/17  2:15 AM  Result Value Ref Range  Status   Specimen Description BLOOD RIGHT ANTECUBITAL  Final   Special Requests   Final    BOTTLES DRAWN AEROBIC AND ANAEROBIC Blood Culture adequate volume   Culture   Final    NO GROWTH 2 DAYS Performed at [facility], [address] [place], [place] [address]    Report Status PENDING  Incomplete  Culture, blood (Routine X 2) w Reflex to ID Panel     Status: None (Preliminary result)   Collection Time: 12/17/17  2:20 AM  Result Value Ref Range Status   Specimen Description BLOOD LEFT ANTECUBITAL  Final   Special Requests   Final    BOTTLES DRAWN AEROBIC AND ANAEROBIC Blood Culture adequate volume   Culture   Final    NO GROWTH 2 DAYS Performed at [facility], [address] [place], [place] [address]    Report Status PENDING  Incomplete       Radiology Studies: Mr Foot Left W Wo Contrast  Result Date: 12/19/2017 CLINICAL DATA:  Foot swelling and diabetes. EXAM: MRI OF THE LEFT FOREFOOT WITHOUT AND WITH CONTRAST MRI OF THE LEFT ANKLE WITH AND WITHOUT CONTRAST  TECHNIQUE: MULTIPLANAR MULTISEQUENCE MR IMAGING WAS OBTAINED THROUGH THE FOREFOOT BEFORE AND AFTER IV CONTRAST ADMINISTRATION. MULTIPLANAR MULTISEQUENCE MR IMAGING WAS OBTAINED THROUGH THE ANKLE BEFORE AND AFTER IV CONTRAST ADMINISTRATION. COMPARISON:  For/28/19 radiographs FINDINGS: MRI FOOT FINDINGS Osseous structures: Indistinctly marginated focal edema and enhancement in the set head of the second metatarsal as on image 12/26. No other findings of suspicion for osteomyelitis. Small degenerative lesion laterally in the distal head of the proximal phalanx of the small toe. Ligaments: Lisfranc ligament intact. No appreciable plantar plate injury. Musculotendinous: Edema tracks within and along the plantar musculature of the foot, along with low-grade enhancement. No drainable abscess. Soft tissues: Subcutaneous edema and potentially mild enhancement in the dorsal subcutaneous tissues of the foot favoring cellulitis. No gas tracking in the soft tissues identified. MRI ANKLE FINDINGS Peroneal tendons: Moderate common peroneus tendon sheath tenosynovitis, also with peroneus longus tenosynovitis. Medial flexors: Tibialis posterior and flexor digitorum longus tenosynovitis and tendinopathy. Flexor hallucis longus tenosynovitis just proximal to the knot of Henry's with surrounding mild peritendinitis. Extensor tendons: Unremarkable Achilles tendon: Unremarkable Plantar fascia: Unremarkable Ligaments Lateral ligamentous complex: Ill-defined anterior inferior tibiofibular ligament on images 7-10 of series 19 with extensive surrounding enhancement/inflammatory findings. Somewhat thin anterior talofibular ligament with extensive surrounding enhancement, possibly from synovitis. Anterolateral impingement not excluded. Medial: Indistinct superomedial portion of the spring ligament. Deltoid ligament grossly intact. Tibiotalar joint: Subtle marrow edema in the medial malleolus and along the site of the growth plate in of the  lateral malleolus. No focal osteochondral lesion is observed. 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 Bones: Dorsal beaking of the distal talar head with an effusion of the talonavicular joint. Other: Subcutaneous edema medially and laterally along the ankle tracking into the dorsum of the foot. IMPRESSION: Combined impression for MRI of the ankle and foot: 1. 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 2. Extensive anterolateral inflammatory findings around the anterior inferior tibiofibular ligament and anterior talofibular ligament, suspicious for synovitis, anterolateral impingement not excluded. 3. Considerable flexor tenosynovitis of  the peroneal tendons and medial flexors, and occluding tendinopathy of the tibialis posterior and flexor digitorum longus. There is thickening and indistinctness of the superomedial portion of the spring ligament probably from chronic micro injury. 4. Ill-defined focal edema and enhancement in the head of the second metatarsal suggesting stress reaction; osteomyelitis is not readily excluded. 5. Edema tracks within along the plantar musculature of the foot and could be neurogenic or due to myositis. 6. Subcutaneous edema tracking along the dorsum of the foot and in the ankle. This could be bland or due to cellulitis. Electronically Signed   By: Gaylyn Rong M.D.   On: 12/19/2017 16:20   Mr Ankle Left W Wo Contrast  Result Date: 12/19/2017 : Please see accession #[id number] for combined forefoot and ankle report. Electronically Signed   By: Gaylyn Rong M.D.   On: 12/19/2017 16:34   Dg Foot Complete Right  Result  Date: 12/18/2017 CLINICAL DATA:  Pain along second third and fourth MTP joints for 3 days. History of recent cellulitis. No trauma. EXAM: RIGHT FOOT COMPLETE - 3+ VIEW COMPARISON:  None. FINDINGS: No fractures or dislocations. No soft tissue gas identified. No bony erosion. IMPRESSION: No acute abnormality noted. Electronically Signed   By: Gerome Sam III M.D   On: 12/18/2017 14:41   [place] Ekg Site Rite  Result Date: 12/20/2017 If Site Rite image not attached, placement could not be confirmed due to current cardiac rhythm.     Scheduled Meds: . nicotine  14 mg Transdermal Daily   Continuous Infusions: . cefTRIAXone (ROCEPHIN)  IV Stopped (12/19/17 1224)  . vancomycin       LOS: 3 days    Time spent: 35 minutes   Noralee Stain, DO [facility] [email or web address] Password [email or web address] 12/20/2017, 11:42 AM

## 2017-12-20 NOTE — Progress Notes (Signed)
Talked to Cliffton AstersJohn Campbell MD infectious disease ;ok'd to place PICC for pt.

## 2017-12-20 NOTE — Progress Notes (Signed)
Patient ID: Michael Welch, male   DOB: [birth year], 22 y.o.   MRN: [id number]         [facility]  Date of Admission:  12/16/2017   Total days of antibiotics 5         ASSESSMENT: He has culture negative septic arthritis involving multiple joints.  He was on a variety of antibiotics prior to his left knee aspiration on 12/07/2017.  No organisms were seen on Gram stain.  I favor treating him for culture negative septic arthritis with 2 weeks of IV vancomycin and ceftriaxone.  PLAN: 1. Continue ceftriaxone 2. Start IV vancomycin  Principal Problem:   Septic arthritis (HCC) Active Problems:   Polyarthritis   SIRS (systemic inflammatory response syndrome) (HCC)   History of gonorrhea   Tobacco abuse   Scheduled Meds: . nicotine  14 mg Transdermal Daily   Continuous Infusions: . cefTRIAXone (ROCEPHIN)  IV Stopped (12/19/17 1224)   PRN Meds:.acetaminophen **OR** acetaminophen, bisacodyl, HYDROcodone-acetaminophen, morphine injection, ondansetron, senna-docusate   SUBJECTIVE: He is still having moderately severe pain in his left knee and ankle.  Review of Systems: Review of Systems  Constitutional: Positive for malaise/fatigue. Negative for chills, diaphoresis and fever.  Respiratory: Negative for cough, sputum production and shortness of breath.   Cardiovascular: Negative for chest pain.  Gastrointestinal: Negative for abdominal pain, diarrhea, nausea and vomiting.  Genitourinary: Negative for dysuria.  Musculoskeletal: Positive for joint pain.  Skin: Negative for rash.  Neurological: Negative for headaches.    Allergies  Allergen Reactions  . Sulfa Antibiotics Anaphylaxis, Nausea And Vomiting and Swelling    Angioedema also  . Clindamycin/Lincomycin Itching    Pt's mother stated that Pt gets red man syndrome with clindamycin, would need benadryl if Pt is to get this med.  . Other Hives    (Sweet) green peas    OBJECTIVE: Vitals:   12/18/17 2023 12/19/17 0342 12/19/17 2056 12/20/17 0432  BP: (!) 98/58 118/68 112/66 112/62  Pulse: 84 80 88 90  Resp: 14 14 17 17   Temp: 98.9 F (37.2 C) 99.5 F (37.5 C) 98.9 F (37.2 C) 98.6 F (37 C)  TempSrc: Oral Oral Oral Oral  SpO2: 98% 98% 100% 100%   There is no height or weight on file to calculate BMI.  Physical Exam  Constitutional:  He appears somewhat uncomfortable due to pain.  HENT:  Mouth/Throat: No oropharyngeal exudate.  Eyes: Conjunctivae are normal.  Cardiovascular: Normal rate, regular rhythm and normal heart sounds.  No murmur heard. Pulmonary/Chest: Effort normal and breath sounds normal.  Abdominal: Soft. He exhibits no distension. There is no tenderness.  Musculoskeletal: He exhibits edema and tenderness.  He has some diffuse swelling of his left ankle and the dorsum of his left foot.  There is erythema around some of his ankle incision but no drainage.  There is erythema on the medial aspect of his left first metatarsal head.  He has only minimal tenderness with palpation and range of motion.  His knee incisions are healing nicely.  He has no significant swelling or erythema of his left knee.    Lab Results Lab Results  Component Value Date   WBC 9.1 12/18/2017   HGB 12.1 (L) 12/18/2017   HCT 37.1 (L) 12/18/2017   MCV 90.9 12/18/2017   PLT 442 (H) 12/18/2017    Lab Results  Component Value Date   CREATININE 0.77 12/18/2017   BUN 6 12/18/2017   NA 138 12/18/2017   K  4.2 12/18/2017   CL 103 12/18/2017   CO2 27 12/18/2017    Lab Results  Component Value Date   ALT 21 12/17/2017   AST 19 12/17/2017   ALKPHOS 60 12/17/2017   BILITOT 0.7 12/17/2017     Microbiology: Recent Results (from the past 240 hour(s))  Blood culture (routine x 2)     Status: None (Preliminary result)   Collection Time: 12/17/17  2:15 AM  Result Value Ref Range Status   Specimen Description BLOOD RIGHT ANTECUBITAL  Final   Special Requests   Final    BOTTLES  DRAWN AEROBIC AND ANAEROBIC Blood Culture adequate volume   Culture   Final    NO GROWTH 2 DAYS Performed at [facility], [address], [place], [place] [address]    Report Status PENDING  Incomplete  Culture, blood (Routine X 2) w Reflex to ID Panel     Status: None (Preliminary result)   Collection Time: 12/17/17  2:20 AM  Result Value Ref Range Status   Specimen Description BLOOD LEFT ANTECUBITAL  Final   Special Requests   Final    BOTTLES DRAWN AEROBIC AND ANAEROBIC Blood Culture adequate volume   Culture   Final    NO GROWTH 2 DAYS Performed at [facility], [address] [place], [place] [address]    Report Status PENDING  Incomplete    Cliffton Asters, MD [facility] [phone] pager   [phone] cell 12/20/2017, 9:27 AM

## 2017-12-21 LAB — BASIC METABOLIC PANEL
ANION GAP: 9 (ref 5–15)
BUN: 7 mg/dL (ref 6–20)
CALCIUM: 9.2 mg/dL (ref 8.9–10.3)
CHLORIDE: 98 mmol/L (ref 98–111)
CO2: 29 mmol/L (ref 22–32)
CREATININE: 0.69 mg/dL (ref 0.61–1.24)
GFR calc non Af Amer: >60 mL/min (ref >60)
Glucose, Bld: 113 mg/dL — ABNORMAL HIGH (ref 70–99)
Potassium: 3.9 mmol/L (ref 3.5–5.1)
SODIUM: 136 mmol/L (ref 135–145)

## 2017-12-21 LAB — HEPATITIS A ANTIBODY, TOTAL: HEP A TOTAL AB: POSITIVE — AB

## 2017-12-21 LAB — CBC
HEMATOCRIT: 36.6 % — AB (ref 39.0–52.0)
Hemoglobin: 12 g/dL — ABNORMAL LOW (ref 13.0–17.0)
MCH: 29.5 pg (ref 26.0–34.0)
MCHC: 32.8 g/dL (ref 30.0–36.0)
MCV: 89.9 fL (ref 78.0–100.0)
PLATELETS: 522 10*3/uL — AB (ref 150–400)
RBC: 4.07 MIL/uL — ABNORMAL LOW (ref 4.22–5.81)
RDW: 11.7 % (ref 11.5–15.5)
WBC: 7.9 10*3/uL (ref 4.0–10.5)

## 2017-12-21 LAB — HEPATITIS B SURFACE ANTIGEN: Hepatitis B Surface Ag: NEGATIVE

## 2017-12-21 LAB — HCV COMMENT:

## 2017-12-21 LAB — VANCOMYCIN, TROUGH: Vancomycin Tr: 10 ug/mL — ABNORMAL LOW (ref 15–20)

## 2017-12-21 LAB — HEPATITIS C ANTIBODY (REFLEX): HCV Ab: 0.4 s/co ratio (ref 0.0–0.9)

## 2017-12-21 MED ORDER — OXYCODONE HCL ER 10 MG PO T12A
10.0000 mg | EXTENDED_RELEASE_TABLET | Freq: Two times a day (BID) | ORAL | Status: DC
  Administered 2017-12-21 – 2017-12-22 (×3): 10 mg via ORAL
  Filled 2017-12-21 (×3): qty 1

## 2017-12-21 MED ORDER — VANCOMYCIN IV (FOR PTA / DISCHARGE USE ONLY): 1250.0000 mg | Freq: Three times a day (TID) | INTRAVENOUS | 0 refills | Status: AC

## 2017-12-21 MED ORDER — MORPHINE SULFATE (PF) 2 MG/ML IV SOLN
2.0000 mg | INTRAVENOUS | Status: DC | PRN
  Administered 2017-12-21 – 2017-12-22 (×3): 2 mg via INTRAVENOUS
  Filled 2017-12-21 (×3): qty 1

## 2017-12-21 MED ORDER — VANCOMYCIN HCL 10 G IV SOLR
1250.0000 mg | Freq: Three times a day (TID) | INTRAVENOUS | Status: DC
  Administered 2017-12-21 – 2017-12-22 (×3): 1250 mg via INTRAVENOUS
  Filled 2017-12-21 (×5): qty 1250

## 2017-12-21 MED ORDER — CEFTRIAXONE IV (FOR PTA / DISCHARGE USE ONLY): 2.0000 g | INTRAVENOUS | 0 refills | Status: AC

## 2017-12-21 MED ORDER — KETOROLAC TROMETHAMINE 15 MG/ML IJ SOLN
15.0000 mg | Freq: Four times a day (QID) | INTRAMUSCULAR | Status: DC
  Administered 2017-12-21 – 2017-12-22 (×5): 15 mg via INTRAVENOUS
  Filled 2017-12-21 (×5): qty 1

## 2017-12-21 NOTE — Progress Notes (Signed)
PHARMACY CONSULT NOTE FOR:  OUTPATIENT  PARENTERAL ANTIBIOTIC THERAPY (OPAT)  Indication: septic arthritis Regimen:  Vancomycin 1250 mg IV q8h Ceftriaxone 2 g IV q24h  End date: 01/04/18  IV antibiotic discharge orders are pended. To discharging provider:  please sign these orders via discharge navigator,  Select New Orders & click on the button choice - Manage This Unsigned Work.      Rolanda Campa, Darl HouseholderAlison M 12/21/2017, 2:14 PM

## 2017-12-21 NOTE — Evaluation (Signed)
Physical Therapy Evaluation Patient Details Name: Michael Welch Siedlecki MRN: [id number] DOB: [birth year] Today's Date: 12/21/2017   History of Present Illness  Michael Welch Brensinger is a 22 year old male with history of substance abuse, recent hospitalization 12/06/2017-12/08/2017 for left ankle and knee pain, suspected septic arthritis, underwent joint aspiration followed by arthroscopic knee irrigation and debridement by orthopedics on 12/07/2017, all cultures negative, initially treated with Zosyn and vancomycin then transitioned to Rocephin and vancomycin per ID recommendations. Returns with persistent pain and swelling.   Clinical Impression  Patient evaluated by Physical Therapy with no further acute PT needs identified. All education has been completed and the patient has no further questions. Pt given LLE there ex with handout as well as progression of exercises to increase WB'ing LLE. Discussed proper positioning. Pt able to ambulate safely with crutches and prefers NWB pattern but practiced ambulating with partial LLE WB'ing and pt tolerated well. Instructed him to work on this 3-4x daily.  See below for any follow-up Physical Therapy or equipment needs. PT is signing off. Thank you for this referral.     Follow Up Recommendations No PT follow up    Equipment Recommendations  None recommended by PT    Recommendations for Other Services       Precautions / Restrictions Precautions Precautions: None Restrictions Weight Bearing Restrictions: No LLE Weight Bearing: Weight bearing as tolerated      Mobility  Bed Mobility Overal bed mobility: Modified Independent                Transfers Overall transfer level: Modified independent Equipment used: Crutches             General transfer comment: pt safe with use of crutches  Ambulation/Gait Ambulation/Gait assistance: Modified independent (Device/Increase time) Gait Distance (Feet): 25 Feet Assistive device: Crutches Gait  Pattern/deviations: Step-to pattern Gait velocity: decreased Gait velocity interpretation: >2.62 ft/sec, indicative of community ambulatory General Gait Details: pt safe with LLE NWB pattern and faster with this pattern but encouraged him to begin putting partial weight through LLE and pt able to do so with slower gait. Instructed him to practice this 3-4x/ day even if he uses NWB pattern for distance mobility  Stairs Stairs: (discussed seated stairs ascent/ descent)          Wheelchair Mobility    Modified Rankin (Stroke Patients Only)       Balance Overall balance assessment: No apparent balance deficits (not formally assessed)                                           Pertinent Vitals/Pain Pain Assessment: Faces Faces Pain Scale: Hurts little more Pain Location: L foot and ankle Pain Descriptors / Indicators: Burning Pain Intervention(s): Monitored during session;Premedicated before session    Home Living Family/patient expects to be discharged to:: Private residence Living Arrangements: Parent Available Help at Discharge: Family;Friend(s) Type of Home: House Home Access: Level entry     Home Layout: Two level Home Equipment: Crutches      Prior Function Level of Independence: Independent with assistive device(s)         Comments: has been using crutches and basically keeping LLE NWB due to pain. Has been going up and down carpeted stairs in sitting for safety     Hand Dominance        Extremity/Trunk Assessment   Upper Extremity Assessment Upper Extremity Assessment:  Overall [facility] for tasks assessed    Lower Extremity Assessment Lower Extremity Assessment: LLE deficits/detail LLE Deficits / Details: pt lacking 5 deg knee ext,  knee flex 100 deg, strength hip flex 4/5, knee flex/ ext NT due to pain, ankle stiff  LLE: Unable to fully assess due to pain LLE Sensation: WNL LLE Coordination: WNL    Cervical / Trunk  Assessment Cervical / Trunk Assessment: Normal  Communication   Communication: No difficulties  Cognition Arousal/Alertness: Awake/alert Behavior During Therapy: WFL for tasks assessed/performed Overall Cognitive Status: Within Functional Limits for tasks assessed                                        General Comments      Exercises General Exercises - Lower Extremity Ankle Circles/Pumps: AROM;Left;20 reps;Seated Quad Sets: AROM;Left;10 reps;Seated Long Arc Quad: AROM;Left;10 reps;Seated Heel Slides: AROM;Left;10 reps;Supine Hip ABduction/ADduction: AROM;Left;10 reps;Supine Straight Leg Raises: AROM;Left;10 reps;Supine Other Exercises Other Exercises: assisted L knee flex in sitting x20 sec Other Exercises: worked through series of standing exercises to increase WB'ing LLE. Patient tolerated well.  Other Exercises: discussed proper positioning of knee including no partial flexed position for extended period of time   Assessment/Plan    PT Assessment Patent does not need any further PT services  PT Problem List         PT Treatment Interventions      PT Goals (Current goals can be found in the Care Plan section)  Acute Rehab PT Goals Patient Stated Goal: decreased pain and inflammation PT Goal Formulation: All assessment and education complete, DC therapy    Frequency     Barriers to discharge        Co-evaluation               AM-PAC PT "6 Clicks" Daily Activity  Outcome Measure Difficulty turning over in bed (including adjusting bedclothes, sheets and blankets)?: None Difficulty moving from lying on back to sitting on the side of the bed? : None Difficulty sitting down on and standing up from a chair with arms (e.g., wheelchair, bedside commode, etc,.)?: None Help needed moving to and from a bed to chair (including a wheelchair)?: None Help needed walking in hospital room?: A Little Help needed climbing 3-5 steps with a railing? : A  Little 6 Click Score: 22    End of Session   Activity Tolerance: Patient tolerated treatment well Patient left: in bed;with call bell/phone within reach;with family/visitor present Nurse Communication: Mobility status PT Visit Diagnosis: Difficulty in walking, not elsewhere classified (R26.2);Pain Pain - Right/Left: Left Pain - part of body: Ankle and joints of foot    Time: 1410-1447 PT Time Calculation (min) (ACUTE ONLY): 37 min   Charges:   PT Evaluation $PT Eval Low Complexity: 1 Low PT Treatments $Therapeutic Exercise: 8-22 mins   PT G Codes:        Lyanne Co, PT  Acute Rehab Services  [phone]   Lawana Chambers Curby Carswell 12/21/2017, 3:32 PM

## 2017-12-21 NOTE — Progress Notes (Addendum)
Pharmacy Antibiotic Note  Glendora Scoreristan Brownlee is a 22 y.o. male admitted on 12/16/2017 with septic arthritis.    See past pharmacy note on 6/28 for more details about this case.  Patient to restart vancomycin and continue ceftriaxone- ID is planning on a 2 week course of both for culture negative septic arthritis  Vancomycin trough returned on the very low end of the therapeutic range, at 10 today. Will increase dose and aim for a goal trough of 15 mcg/dl.  Plan: Vancomycin 1250 mg IV q8h Ceftriaxone 2g IV q24h Monitor renal fx, cultures, will need another vancomycin trough at steady or at least by the end of this week.     Temp (24hrs), Avg:98.7 F (37.1 C), Min:98.6 F (37 C), Max:99 F (37.2 C)  Recent Labs  Lab 12/17/17 0219 12/17/17 0230 12/18/17 0414 12/21/17 0415 12/21/17 1131  WBC 10.8*  --  9.1 7.9  --   CREATININE 0.77  --  0.77  --  0.69  LATICACIDVEN  --  0.69  --   --   --   VANCOTROUGH  --   --   --   --  10*    Allergies  Allergen Reactions  . Sulfa Antibiotics Anaphylaxis, Nausea And Vomiting and Swelling    Angioedema also  . Clindamycin/Lincomycin Itching    Pt's mother stated that Pt gets red man syndrome with clindamycin, would need benadryl if Pt is to get this med.  . Other Hives    (Sweet) green peas    Antimicrobials this admission: Vancomycin 6/28>>6/29; 7/1>> Cefazolin 6/29 x1 Ceftriaxone 6/29>>  Dose adjustments this admission: n/a  Microbiology results: Previous admission-  6/17 BCx: neg 6/18 L knee fluid: neg 6/18 GC/chlamydia: neg 6/28 BCx: ngtd   Baldemar FridayMasters, Jamone Garrido M 12/21/2017 2:08 PM

## 2017-12-21 NOTE — Progress Notes (Signed)
Patient ID: Michael Welch, male   DOB: 04-18-1996, 22 y.o.   MRN: [id number]         [facility]  Date of Admission:  12/16/2017   Total days of antibiotics 6         ASSESSMENT: He is improving slowly on therapy for culture negative, polyarticular septic arthritis.  I plan on 2 more weeks of empiric vancomycin and ceftriaxone.  PLAN: 1. Continue vancomycin and ceftriaxone 2. I will sign off now but arrange follow-up in my clinic  Diagnosis: Septic arthritis  Culture Result: Culture negative  Allergies  Allergen Reactions  . Sulfa Antibiotics Anaphylaxis, Nausea And Vomiting and Swelling    Angioedema also  . Clindamycin/Lincomycin Itching    Pt's mother stated that Pt gets red man syndrome with clindamycin, would need benadryl if Pt is to get this med.  . Other Hives    (Sweet) green peas    OPAT Orders Discharge antibiotics: Per pharmacy protocol vancomycin and ceftriaxone Aim for Vancomycin trough 15-20 (unless otherwise indicated) Duration: 2 weeks End Date: 01/04/2018  [facility] Care Per Protocol:  Labs weekly while on IV antibiotics: _x_ CBC with differential _x_ BMP __ CMP _x_ CRP _x_ ESR __ Vancomycin trough  _x_ Please pull PIC at completion of IV antibiotics __ Please leave PIC in place until doctor has seen patient or been notified  Fax weekly labs to [phone]  Clinic Follow Up Appt: I will arrange follow-up in my clinic in 2 to 3 weeks  Principal Problem:   Septic arthritis ([place]) Active Problems:   Polyarthritis   SIRS (systemic inflammatory response syndrome) (HCC)   History of gonorrhea   Tobacco abuse   Scheduled Meds: . ketorolac  15 mg Intravenous Q6H  . nicotine  14 mg Transdermal Daily  . oxyCODONE  10 mg Oral Q12H  . sodium chloride flush  10-40 mL Intracatheter Q12H   Continuous Infusions: . cefTRIAXone (ROCEPHIN)  IV 2 g (12/21/17 1324)  . vancomycin     PRN Meds:.acetaminophen **OR**  acetaminophen, bisacodyl, morphine injection, ondansetron, senna-docusate, sodium chloride flush   SUBJECTIVE: He is still having moderately severe pain in his left knee and ankle.  He has not been out of bed recently and is under the impression that he is not supposed to move his leg or stand and bear weight.  He has not been working with physical therapy.  Review of Systems: Review of Systems  Constitutional: Positive for malaise/fatigue. Negative for chills, diaphoresis and fever.  Respiratory: Negative for cough, sputum production and shortness of breath.   Cardiovascular: Negative for chest pain.  Gastrointestinal: Negative for abdominal pain, diarrhea, nausea and vomiting.  Genitourinary: Negative for dysuria.  Musculoskeletal: Positive for joint pain.  Skin: Negative for rash.  Neurological: Negative for headaches.    Allergies  Allergen Reactions  . Sulfa Antibiotics Anaphylaxis, Nausea And Vomiting and Swelling    Angioedema also  . Clindamycin/Lincomycin Itching    Pt's mother stated that Pt gets red man syndrome with clindamycin, would need benadryl if Pt is to get this med.  . Other Hives    (Sweet) green peas    OBJECTIVE: Vitals:   12/20/17 0432 12/20/17 1407 12/20/17 2116 12/21/17 0420  BP: 112/62 114/66 104/70 119/81  Pulse: 90 79 78 84  Resp: '17 16 18   '$ Temp: 98.6 F (37 C) 99 F (37.2 C) 98.6 F (37 C) 98.6 F (37 C)  TempSrc: Oral Oral Oral Oral  SpO2: 100% 99% 99% 99%   There is no height or weight on file to calculate BMI.  Physical Exam  Constitutional:  He is alert and resting quietly in bed visiting with family.  HENT:  Mouth/Throat: No oropharyngeal exudate.  Eyes: Conjunctivae are normal.  Cardiovascular: Normal rate, regular rhythm and normal heart sounds.  No murmur heard. Pulmonary/Chest: Effort normal and breath sounds normal.  Abdominal: Soft. He exhibits no distension. There is no tenderness.  Musculoskeletal: He exhibits edema and  tenderness.  His left knee and ankle incisions are healing nicely.  He has no sick no forget warmth, swelling or redness of his left knee.  The erythema around his left ankle incisions have decreased.  He still has nonpainful erythema around his left first metatarsal head.    Lab Results Lab Results  Component Value Date   WBC 7.9 12/21/2017   HGB 12.0 (L) 12/21/2017   HCT 36.6 (L) 12/21/2017   MCV 89.9 12/21/2017   PLT 522 (H) 12/21/2017    Lab Results  Component Value Date   CREATININE 0.69 12/21/2017   BUN 7 12/21/2017   NA 136 12/21/2017   K 3.9 12/21/2017   CL 98 12/21/2017   CO2 29 12/21/2017    Lab Results  Component Value Date   ALT 21 12/17/2017   AST 19 12/17/2017   ALKPHOS 60 12/17/2017   BILITOT 0.7 12/17/2017     Microbiology: Recent Results (from the past 240 hour(s))  Blood culture (routine x 2)     Status: None (Preliminary result)   Collection Time: 12/17/17  2:15 AM  Result Value Ref Range Status   Specimen Description BLOOD RIGHT ANTECUBITAL  Final   Special Requests   Final    BOTTLES DRAWN AEROBIC AND ANAEROBIC Blood Culture adequate volume   Culture   Final    NO GROWTH 4 DAYS Performed at [facility], [place] [address], [place], [place] [address]    Report Status PENDING  Incomplete  Culture, blood (Routine X 2) w Reflex to ID Panel     Status: None (Preliminary result)   Collection Time: 12/17/17  2:20 AM  Result Value Ref Range Status   Specimen Description BLOOD LEFT ANTECUBITAL  Final   Special Requests   Final    BOTTLES DRAWN AEROBIC AND ANAEROBIC Blood Culture adequate volume   Culture   Final    NO GROWTH 4 DAYS Performed at [facility], [place] [address] [place], [place] [address]    Report Status PENDING  Incomplete    Michel Bickers, MD [facility] [place] [facility] [phone] pager   [phone] cell 12/21/2017, 2:00 PM

## 2017-12-21 NOTE — Progress Notes (Signed)
PROGRESS NOTE    Michael Welch  ZOX:[id number] DOB: 1996-01-24 DOA: 12/16/2017 PCP: Estanislado Pandy, MD     Brief Narrative:  Michael Welch is a 22 year old male with history of substance abuse, recent hospitalization 12/06/2017-12/08/2017 for left ankle and knee pain, suspected septic arthritis, underwent joint aspiration followed by arthroscopic knee irrigation and debridement by orthopedics on 12/07/2017, all cultures negative, initially treated with Zosyn and vancomycin then transitioned to Rocephin and vancomycin per ID recommendations, GC and Chlamydia testing negative, patient threatened to leave AMA but eventually discharged on doxycycline 100 mg twice daily x1 month which he claims compliance to, presented with persistent pain and swelling of his left knee and ankle and worsening pain of left hip and low back for which he was seen in OSH in [place] 3 times for pain control and eventually presented to [facility] ED. ID and orthopedic surgery were consulted.    New events last 24 hours / Subjective: PICC placed yesterday. Continues to have severe left ankle and left knee pain. He is still sleeping at 11:30am, lights turned off, states he cannot walk due to pain   Assessment & Plan:   Principal Problem:   Septic arthritis (HCC) Active Problems:   SIRS (systemic inflammatory response syndrome) (HCC)   Polyarthritis   History of gonorrhea   Tobacco abuse   Polyarticular septic arthritis of left knee and ankle: Remains culture negative. Recent evaluation including joint aspiration, I&D cultures negative including GC and Chlamydia testing.  Despite empiric IV antibiotics in hospital followed by oral doxycycline as outpatient, worsening symptoms.  Blood cultures x2: Negative to date.  Left knee x-ray without acute findings.  Left ankle x-ray shows soft tissue swelling around the malleoli but no acute findings.  Orthopedics follow-up 6/30 appreciated and no surgical intervention planned. MRI left  ankleshowed tenosynovitis, edema.  ID planning for 2 weeks of IV vanco/ceftriaxone. PICC in place.   Left hip pain & low back pain: ?Related to asymmetric weightbearing and use of crutches due to his left knee and ankle pain.  Improved.  X-ray of left hip and MRI of left hip without acute findings.  Polysubstance abuse (tobacco and THC): Cessation counseled.  Nicotine patch.    DVT prophylaxis: SCD Code Status: Full Family Communication: At bedside  Disposition Plan: Pending clinical improvement in pain control, decreased IV morphine and added IV toradol and long-acting oxy instead of norco    Consultants:   ID  Orthopedic surgery   Procedures:   None   Antimicrobials:  Anti-infectives (From admission, onward)   Start     Dose/Rate Route Frequency Ordered Stop   12/20/17 1030  vancomycin (VANCOCIN) IVPB 1000 mg/200 mL premix     1,000 mg 200 mL/hr over 60 Minutes Intravenous Every 8 hours 12/20/17 1009     12/18/17 1400  ceFAZolin (ANCEF) IVPB 2g/100 mL premix  Status:  Discontinued     2 g 200 mL/hr over 30 Minutes Intravenous Every 8 hours 12/18/17 1013 12/18/17 1221   12/18/17 1230  cefTRIAXone (ROCEPHIN) 2 g in sodium chloride 0.9 % 100 mL IVPB     2 g 200 mL/hr over 30 Minutes Intravenous Every 24 hours 12/18/17 1221     12/18/17 0000  cefTRIAXone (ROCEPHIN) 1 g in sodium chloride 0.9 % 100 mL IVPB  Status:  Discontinued     1 g 200 mL/hr over 30 Minutes Intravenous Every 24 hours 12/17/17 0723 12/18/17 0918   12/17/17 1000  vancomycin (VANCOCIN) IVPB 1000 mg/200 mL  premix  Status:  Discontinued     1,000 mg 200 mL/hr over 60 Minutes Intravenous Every 8 hours 12/17/17 0747 12/18/17 0918   12/17/17 0215  cefTRIAXone (ROCEPHIN) 1 g in sodium chloride 0.9 % 100 mL IVPB     1 g 200 mL/hr over 30 Minutes Intravenous  Once 12/17/17 0204 12/17/17 0324   12/17/17 0215  vancomycin (VANCOCIN) 1,500 mg in sodium chloride 0.9 % 500 mL IVPB     1,500 mg 250 mL/hr over 120  Minutes Intravenous  Once 12/17/17 0208 12/17/17 0526       Objective: Vitals:   12/20/17 0432 12/20/17 1407 12/20/17 2116 12/21/17 0420  BP: 112/62 114/66 104/70 119/81  Pulse: 90 79 78 84  Resp: 17 16 18    Temp: 98.6 F (37 C) 99 F (37.2 C) 98.6 F (37 C) 98.6 F (37 C)  TempSrc: Oral Oral Oral Oral  SpO2: 100% 99% 99% 99%    Intake/Output Summary (Last 24 hours) at 12/21/2017 1235 Last data filed at 12/20/2017 1730 Gross per 24 hour  Intake 540 ml  Output -  Net 540 ml   There were no vitals filed for this visit.  Examination: General exam: Appears calm and comfortable, sleeping  Respiratory system: Clear to auscultation. Respiratory effort normal. Cardiovascular system: S1 & S2 heard, RRR. No JVD, murmurs, rubs, gallops or clicks. No pedal edema. Gastrointestinal system: Abdomen is nondistended, soft and nontender. No organomegaly or masses felt. Normal bowel sounds heard. Central nervous system: Alert and oriented. No focal neurological deficits. Extremities: Left ankle and left knee with pain with palpation, mild effusion and edema  Skin: No rashes, lesions or ulcers Psychiatry: Judgement and insight appear normal. Mood & affect appropriate.    Data Reviewed: I have personally reviewed following labs and imaging studies  CBC: Recent Labs  Lab 12/17/17 0219 12/18/17 0414 12/21/17 0415  WBC 10.8* 9.1 7.9  NEUTROABS 7.3  --   --   HGB 13.0 12.1* 12.0*  HCT 38.9* 37.1* 36.6*  MCV 89.6 90.9 89.9  PLT 476* 442* 522*   Basic Metabolic Panel: Recent Labs  Lab 12/17/17 0219 12/18/17 0414  NA 136 138  K 4.0 4.2  CL 102 103  CO2 24 27  GLUCOSE 112* 105*  BUN 8 6  CREATININE 0.77 0.77  CALCIUM 9.3 9.4   GFR: Estimated Creatinine Clearance: 159.3 mL/min (by C-G formula based on SCr of 0.77 mg/dL). Liver Function Tests: Recent Labs  Lab 12/17/17 0219  AST 19  ALT 21  ALKPHOS 60  BILITOT 0.7  PROT 7.2  ALBUMIN 3.1*   No results for input(s):  LIPASE, AMYLASE in the last 168 hours. No results for input(s): AMMONIA in the last 168 hours. Coagulation Profile: No results for input(s): INR, PROTIME in the last 168 hours. Cardiac Enzymes: No results for input(s): CKTOTAL, CKMB, CKMBINDEX, TROPONINI in the last 168 hours. BNP (last 3 results) No results for input(s): PROBNP in the last 8760 hours. HbA1C: No results for input(s): HGBA1C in the last 72 hours. CBG: No results for input(s): GLUCAP in the last 168 hours. Lipid Profile: No results for input(s): CHOL, HDL, LDLCALC, TRIG, CHOLHDL, LDLDIRECT in the last 72 hours. Thyroid Function Tests: No results for input(s): TSH, T4TOTAL, FREET4, T3FREE, THYROIDAB in the last 72 hours. Anemia Panel: No results for input(s): VITAMINB12, FOLATE, FERRITIN, TIBC, IRON, RETICCTPCT in the last 72 hours. Sepsis Labs: Recent Labs  Lab 12/17/17 0230  LATICACIDVEN 0.69    Recent Results (  from the past 240 hour(s))  Blood culture (routine x 2)     Status: None (Preliminary result)   Collection Time: 12/17/17  2:15 AM  Result Value Ref Range Status   Specimen Description BLOOD RIGHT ANTECUBITAL  Final   Special Requests   Final    BOTTLES DRAWN AEROBIC AND ANAEROBIC Blood Culture adequate volume   Culture   Final    NO GROWTH 4 DAYS Performed at [facility], [address] [place], [place] [address]    Report Status PENDING  Incomplete  Culture, blood (Routine X 2) w Reflex to ID Panel     Status: None (Preliminary result)   Collection Time: 12/17/17  2:20 AM  Result Value Ref Range Status   Specimen Description BLOOD LEFT ANTECUBITAL  Final   Special Requests   Final    BOTTLES DRAWN AEROBIC AND ANAEROBIC Blood Culture adequate volume   Culture   Final    NO GROWTH 4 DAYS Performed at [facility], [address] [place], [place] [address]    Report Status PENDING  Incomplete       Radiology Studies: Mr Foot Left W Wo Contrast  Result Date:  12/19/2017 CLINICAL DATA:  Foot swelling and diabetes. EXAM: MRI OF THE LEFT FOREFOOT WITHOUT AND WITH CONTRAST MRI OF THE LEFT ANKLE WITH AND WITHOUT CONTRAST TECHNIQUE: MULTIPLANAR MULTISEQUENCE MR IMAGING WAS OBTAINED THROUGH THE FOREFOOT BEFORE AND AFTER IV CONTRAST ADMINISTRATION. MULTIPLANAR MULTISEQUENCE MR IMAGING WAS OBTAINED THROUGH THE ANKLE BEFORE AND AFTER IV CONTRAST ADMINISTRATION. COMPARISON:  For/28/19 radiographs FINDINGS: MRI FOOT FINDINGS Osseous structures: Indistinctly marginated focal edema and enhancement in the set head of the second metatarsal as on image 12/26. No other findings of suspicion for osteomyelitis. Small degenerative lesion laterally in the distal head of the proximal phalanx of the small toe. Ligaments: Lisfranc ligament intact. No appreciable plantar plate injury. Musculotendinous: Edema tracks within and along the plantar musculature of the foot, along with low-grade enhancement. No drainable abscess. Soft tissues: Subcutaneous edema and potentially mild enhancement in the dorsal subcutaneous tissues of the foot favoring cellulitis. No gas tracking in the soft tissues identified. MRI ANKLE FINDINGS Peroneal tendons: Moderate common peroneus tendon sheath tenosynovitis, also with peroneus longus tenosynovitis. Medial flexors: Tibialis posterior and flexor digitorum longus tenosynovitis and tendinopathy. Flexor hallucis longus tenosynovitis just proximal to the knot of Henry's with surrounding mild peritendinitis. Extensor tendons: Unremarkable Achilles tendon: Unremarkable Plantar fascia: Unremarkable Ligaments Lateral ligamentous complex: Ill-defined anterior inferior tibiofibular ligament on images 7-10 of series 19 with extensive surrounding enhancement/inflammatory findings. Somewhat thin anterior talofibular ligament with extensive surrounding enhancement, possibly from synovitis. Anterolateral impingement not excluded. Medial: Indistinct superomedial portion of the  spring ligament. Deltoid ligament grossly intact. Tibiotalar joint: Subtle marrow edema in the medial malleolus and along the site of the growth plate in of the lateral malleolus. No focal osteochondral lesion is observed. 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 Bones: Dorsal beaking of the distal talar head with an effusion of the talonavicular joint. Other: Subcutaneous edema medially and laterally along the ankle tracking into the dorsum of the foot. IMPRESSION: Combined impression for MRI of the ankle and foot: 1. 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 2. Extensive anterolateral inflammatory findings around the anterior inferior tibiofibular ligament and anterior talofibular ligament, suspicious for synovitis, anterolateral impingement not excluded. 3. Considerable flexor tenosynovitis of the peroneal tendons and medial flexors, and occluding tendinopathy of the tibialis posterior and flexor digitorum longus. There is thickening and indistinctness of the superomedial portion of the spring ligament probably from chronic micro injury. 4. Ill-defined focal edema and enhancement in the head of the second metatarsal suggesting stress reaction; osteomyelitis is not readily excluded. 5. Edema tracks within along the plantar musculature of the foot and could be neurogenic or due to myositis. 6. Subcutaneous edema tracking along the dorsum of the foot and in the ankle. This could be bland or due to cellulitis. Electronically Signed   By: Gaylyn Rong M.D.   On: 12/19/2017 16:20   Mr Ankle Left W Wo Contrast  Result Date: 12/19/2017 : Please see accession  #[id number] for combined forefoot and ankle report. Electronically Signed   By: Gaylyn Rong M.D.   On: 12/19/2017 16:34   [place] Ekg Site Rite  Result Date: 12/20/2017 If Site Rite image not attached, placement could not be confirmed due to current cardiac rhythm.     Scheduled Meds: . ketorolac  15 mg Intravenous Q6H  . nicotine  14 mg Transdermal Daily  . oxyCODONE  10 mg Oral Q12H  . sodium chloride flush  10-40 mL Intracatheter Q12H   Continuous Infusions: . cefTRIAXone (ROCEPHIN)  IV Stopped (12/20/17 1359)  . vancomycin 1,000 mg (12/21/17 1221)     LOS: 4 days    Time spent: 25 minutes   Noralee Stain, DO [facility] [email or web address] Password [facility] 12/21/2017, 12:35 PM

## 2017-12-21 NOTE — Consult Note (Signed)
   [facility] CM Inpatient Consult   12/21/2017  Mohamud Mrozek [birth year] [id number]   Patient is currently active with [facility] for chronic disease management services.  Patient has been engaged by a Research scientist (medical).Patient assessed for multiple admissions. Primary Care Provider is listed as Manon Hilding in [place]. Chart review from ED reveals:  Swanson Farnell is a 22 year old male with history of substance abuse, recent hospitalization 12/06/2017-12/08/2017 for left ankle and knee pain, suspected septic arthritis, underwent joint aspiration followed by arthroscopic knee irrigation and debridement by orthopedics on 12/07/2017, all cultures negative, initially treated with Zosyn and vancomycin then transitioned to Rocephin and vancomycin per ID recommendations, GC and Chlamydia testing negative, patient threatened to leave AMA but eventually discharged on doxycycline 100 mg twice daily x1 month which he claims compliance to, presented with persistent pain and swelling of his left knee and ankle and worsening pain of left hip and low back for which he was seen in OSH in [place] 3 times for pain control and eventually presented to [facility] ED. Met with the patient and family, father at bedside.  Received approval to speak to him in front of family.  Flat affect, states, "I guess."  Explained [facility] Care Management [program] and post hospital follow up and he states, "I guess, I'll look at it."  Patient was given a brochure, 24 hour nurse advise line with contact information.  Patient agreeable verbally for post hospital follow up calls.  "I guess so."  Father interjects, "Christella Scheuermann is big on care management son." Disposition is for patient to go home with IV antibiotics with [facility].   Natividad Brood, RN BSN [facility] Liaison  [phone] business mobile phone Toll free office [phone]

## 2017-12-22 ENCOUNTER — Other Ambulatory Visit: Payer: Self-pay | Admitting: Pharmacist

## 2017-12-22 DIAGNOSIS — M00069 Staphylococcal arthritis, unspecified knee: Secondary | ICD-10-CM

## 2017-12-22 LAB — CULTURE, BLOOD (ROUTINE X 2)
Culture: NO GROWTH
Culture: NO GROWTH
SPECIAL REQUESTS: ADEQUATE
Special Requests: ADEQUATE

## 2017-12-22 MED ORDER — NICOTINE 14 MG/24HR TD PT24: 14.0000 mg | MEDICATED_PATCH | Freq: Every day | TRANSDERMAL | 0 refills | Status: DC

## 2017-12-22 MED ORDER — SENNOSIDES-DOCUSATE SODIUM 8.6-50 MG PO TABS: 1.0000 | ORAL_TABLET | Freq: Every evening | ORAL | Status: DC | PRN

## 2017-12-22 MED ORDER — OXYCODONE HCL ER 10 MG PO T12A: 10.0000 mg | EXTENDED_RELEASE_TABLET | Freq: Two times a day (BID) | ORAL | 0 refills | Status: DC

## 2017-12-22 MED ORDER — TRAMADOL HCL 50 MG PO TABS: 50.0000 mg | ORAL_TABLET | Freq: Four times a day (QID) | ORAL | 0 refills | Status: AC | PRN

## 2017-12-22 MED ORDER — ACETAMINOPHEN 325 MG PO TABS: 650.0000 mg | ORAL_TABLET | Freq: Four times a day (QID) | ORAL | Status: AC | PRN

## 2017-12-22 NOTE — Progress Notes (Signed)
[facility] has provided IVA BX teaching with pt, Misty StanleyStacey- girlfriend and patient's father last p.m.  [facility] is prepared for DC today if hospital/ID team in agreement to plan.   If patient discharges after hours, please call [phone].   Michael Welch 12/22/2017, 8:48 AM

## 2017-12-22 NOTE — Progress Notes (Signed)
Discharge instructions completed with pt.  Pt denies chest pain, shortness of breath, dizziness, lightheadedness, and n/v.  Pt discharged home.

## 2017-12-22 NOTE — Discharge Summary (Signed)
Physician Discharge Summary  Daine Gunther YKZ:[id number] DOB: 08/27/1995 DOA: 12/16/2017  PCP: Manon Hilding, MD  Admit date: 12/16/2017 Discharge date: 12/22/2017  Time spent: 40 minutes  Recommendations for Outpatient Follow-up:  1. Complete ceftriaxone and vancomycin as per ID as an outpatient and pull PICC line subsequent to this as per home health infectious disease protocol 2. Needs outpatient follow-up with Dr. Campbell/Dr. Drucilla Schmidt 3. Follow-up with PCP in 5 days for refills on pain meds 4. Will need outpatient work-up for polyarticular arthritis if he has recurrence and would include rheumatological panel at that time  Discharge Diagnoses:  Principal Problem:   Septic arthritis ([place]) Active Problems:   SIRS (systemic inflammatory response syndrome) ([place])   Polyarthritis   History of gonorrhea   Tobacco abuse   Discharge Condition: Improved  Diet recommendation: Heart healthy  There were no vitals filed for this visit.  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 which was culture negative-evaluation previously included aspiration I&D cultures were negative including GC and Chlamydia testing (was treated for gonorrhea 1 month prior to index admission)-blood cultures negative, Left knee x-rays negative MRI showed only tenosynovitis and edema PICC line placed under the guidance of ID and will complete IV antibiotics 7/17  Left hip pain low back pain solving slowly  Tobacco and THC use-patient counseled nicotine patch given    Consultations:  Infectious  disease  Discharge Exam: Vitals:   12/21/17 2043 12/22/17 0519  BP: 118/71 115/69  Pulse: 85 73  Resp: 16 13  Temp: 98.4 F (36.9 C) 98 F (36.7 C)  SpO2: 98% 100%    General: Awake alert pleasant in no distress although still has some pain Cardiovascular: S2 no murmur rub or gallop Respiratory: Clinically clear no added sound Abdomen soft nontender nondistended no rebound Left knee has mild erythema but swelling of both knee and ankle on the left side is not really appreciable he does have some sutures in  Discharge Instructions   Discharge Instructions    Diet - low sodium heart healthy   Complete by:  As directed    Discharge instructions   Complete by:  As directed    Complete antibiotics Follow with OP Dr. Megan Salon for Infectious disease input See Dr. Quintin Alto in 5 days for refills on pain meds as needed   Home infusion instructions [facility] May follow [place] Dosing Protocol; May administer Cathflo as needed to maintain patency of vascular access device.; Flushing of vascular access device: per [facility] Protocol: 0.9% NaCl pre/post medica...   Complete by:  As directed    Instructions:  May follow [place] Dosing Protocol   Instructions:  May administer Cathflo as needed to maintain patency of vascular access device.   Instructions:  Flushing of vascular access device: per [facility] Protocol: 0.9% NaCl pre/post medication administration and prn patency; Heparin 100 u/ml, 17m for implanted ports and Heparin 10u/ml, 518mfor all other central venous catheters.   Instructions:  May follow [facility] Anaphylaxis Protocol for First Dose Administration in the home: 0.9% NaCl at 25-50 ml/hr to maintain IV access for protocol  meds. Epinephrine 0.3 ml IV/IM PRN and Benadryl 25-50 IV/IM PRN s/s of anaphylaxis.   Instructions:  [place] Infusion Coordinator (RN) to assist per patient IV care needs in the home PRN.   Home infusion instructions [facility] May follow [place] Dosing Protocol; May administer Cathflo as needed to maintain patency of vascular access device.; Flushing of vascular access device: per [facility] Protocol: 0.9% NaCl pre/post medica...   Complete by:  As directed    Instructions:  May follow [place] Dosing Protocol   Instructions:  May administer Cathflo as needed to maintain patency of vascular access device.   Instructions:  Flushing of vascular access device: per [facility] Protocol: 0.9% NaCl pre/post medication administration and prn patency; Heparin 100 u/ml, 90m for implanted ports and Heparin 10u/ml, 525mfor all other central venous catheters.   Instructions:  May follow [facility] Anaphylaxis Protocol for First Dose Administration in the home: 0.9% NaCl at 25-50 ml/hr to maintain IV access for protocol meds. Epinephrine 0.3 ml IV/IM PRN and Benadryl 25-50 IV/IM PRN s/s of anaphylaxis.   Instructions:  [facility] Coordinator (RN) to assist per patient IV care needs in the home PRN.   Increase activity slowly   Complete by:  As directed      Allergies as of 12/22/2017      Reactions   Sulfa Antibiotics Anaphylaxis, Nausea And Vomiting, Swelling   Angioedema also   Clindamycin/lincomycin Itching   Pt's mother stated that Pt gets red man syndrome with clindamycin, would need benadryl if Pt is to get this med.   Other Hives   (Sweet) green peas      Medication List    STOP taking these medications   doxycycline 100 MG capsule Commonly known as:  VIBRAMYCIN     TAKE these medications   acetaminophen 325 MG tablet Commonly known as:  TYLENOL Take 2 tablets (650 mg total) by mouth every 6 (six) hours as needed for mild pain (or Fever >/= 101).   aspirin EC 81 MG tablet Take 1 tablet (81 mg total) by mouth daily. For DVT Prophylaxis.   cefTRIAXone IVPB Commonly known as:  ROCEPHIN Inject 2 g into the vein daily for 14 days. Indication:  Septic arthritis Last Day of Therapy:  01/04/18 Labs - Once weekly:  CBC/D and  BMP, Labs - Every other week:  ESR and CRP   CLEAR EYES COMPLETE OP Apply 2 drops to eye 3 (three) times daily as needed (to affected eye for relief of redness).   EXCEDRIN MIGRAINE PO Take 1 tablet by mouth 3 (three) times daily as needed (for migraines or headaches).   nicotine 14 mg/24hr patch Commonly known as:  NICODERM CQ - dosed in mg/24 hours Place 1 patch (14 mg total) onto the skin daily. Start taking on:  12/23/2017   oxyCODONE 10 mg 12 hr tablet Commonly known as:  OXYCONTIN Take 1 tablet (10 mg total) by mouth every 12 (twelve) hours.   senna-docusate 8.6-50 MG tablet Commonly known as:  Senokot-S Take 1 tablet by mouth at bedtime as needed for mild constipation.   traMADol 50 MG tablet Commonly known as:  ULTRAM Take 1 tablet (50 mg total) by mouth every 6 (six) hours as needed for up to 5 days.   vancomycin IVPB Inject 1,250 mg into the vein every 8 (eight) hours for 14 days. Indication:  Septic arthritis Last Day of Therapy:  01/04/18 Labs - Sunday/Monday:  CBC/D, BMP,  and vancomycin trough. Labs - Thursday:  BMP and vancomycin trough Labs - Every other week:  ESR and CRP   VOLTAREN 1 % Gel Generic drug:  diclofenac sodium Apply 2 g topically 4 (four) times daily as needed (to painful sites, as directed).            Home Infusion Instuctions  (From admission, onward)        Start     Ordered   12/21/17 0000  Home infusion instructions [facility] May follow [place] Dosing Protocol; May administer Cathflo as needed to maintain patency of vascular access device.; Flushing of vascular access device: per [facility] Protocol: 0.9% NaCl pre/post medica...    Question Answer Comment  Instructions May follow [place] Dosing Protocol   Instructions May administer Cathflo as needed to maintain patency of vascular access device.   Instructions Flushing of vascular access device: per [facility] Protocol: 0.9% NaCl pre/post medication administration and prn  patency; Heparin 100 u/ml, 62m for implanted ports and Heparin 10u/ml, 534mfor all other central venous catheters.   Instructions May follow [facility] Anaphylaxis Protocol for First Dose Administration in the home: 0.9% NaCl at 25-50 ml/hr to maintain IV access for protocol meds. Epinephrine 0.3 ml IV/IM PRN and Benadryl 25-50 IV/IM PRN s/s of anaphylaxis.   Instructions [facility] Infusion Coordinator (RN) to assist per patient IV care needs in the home PRN.      12/21/17 1412   12/21/17 0000  Home infusion instructions [facility] May follow [facility] Protocol; May administer Cathflo as needed to maintain patency of vascular access device.; Flushing of vascular access device: [facility]: 0.9% NaCl pre/post medica...    Question Answer Comment  Instructions May follow [facility] Protocol   Instructions May administer Cathflo as needed to maintain patency of vascular access device.   Instructions Flushing of vascular access device: per [facility]: 0.9% NaCl pre/post medication administration and prn patency; Heparin 100 u/ml, 6m68mor implanted ports and Heparin 10u/ml, 6ml107mr all other central venous catheters.   Instructions May follow [facility] Anaphylaxis Protocol for First Dose Administration in the home: 0.9% NaCl at 25-50 ml/hr to maintain IV access for protocol meds. Epinephrine 0.3 ml IV/IM PRN and Benadryl 25-50 IV/IM PRN s/s of anaphylaxis.   Instructions [facility] Infusion Coordinator (RN) to assist per patient IV care needs in the home PRN.      12/21/17 1413     Allergies  Allergen Reactions  . Sulfa Antibiotics Anaphylaxis, Nausea And Vomiting and Swelling    Angioedema also  . Clindamycin/Lincomycin Itching    Pt's mother stated that Pt gets red man syndrome with clindamycin, would need benadryl if Pt is to get this med.  . Other Hives    (Sweet) green peas   Follow-up Information    [facility], [facility]-Home Follow up.    Specialty:  [place]:  Home Health RN- agency will call to arrange appointment Contact information: [address] [phone]        The results of significant diagnostics from this hospitalization (including imaging, microbiology, ancillary and laboratory) are listed below for reference.    Significant Diagnostic Studies: Dg Knee 2 Views Left  Result Date: 12/17/2017 CLINICAL DATA:  Septic arthritis follow-up. EXAM: LEFT KNEE - 1-2 VIEW COMPARISON:  12/07/2017 FINDINGS: No evidence of acute fracture or joint dislocation. Interval decrease in suprapatellar  joint effusion. No evidence of arthropathy or other focal bone abnormality. Soft tissues are unremarkable. IMPRESSION: No acute fracture, suspicious osseous lesion or bone destruction. Near complete resolution of suprapatellar joint effusion. Electronically Signed   By: Ashley Royalty M.D.   On: 12/17/2017 03:20   Dg Ankle Complete Left  Result Date: 12/17/2017 CLINICAL DATA:  Septic arthritis EXAM: LEFT ANKLE COMPLETE - 3+ VIEW COMPARISON:  12/07/2017 FINDINGS: Stable soft tissue swelling about the malleoli. Interval decrease in ankle joint effusion. No frank bone destruction or fracture. IMPRESSION: Mild-to-moderate soft tissue swelling about the malleoli persists. Interval decrease in tibiotalar joint effusion however is noted. No frank bone destruction is seen. Electronically Signed   By: Ashley Royalty M.D.   On: 12/17/2017 03:25   Dg Ankle Complete Left  Result Date: 12/07/2017 CLINICAL DATA:  Ankle pain for several days EXAM: LEFT ANKLE COMPLETE - 3+ VIEW COMPARISON:  11/30/2017 FINDINGS: Mild soft tissue swelling is noted although slightly improved when compare with the prior exam. Previously seen joint effusion has reduced as well. No acute bony abnormality is noted. IMPRESSION: Reduction in soft tissue swelling and joint effusion when compared with the prior exam. Electronically  Signed   By: Inez Catalina M.D.   On: 12/07/2017 09:37   Dg Ankle Complete Left  Result Date: 11/30/2017 CLINICAL DATA:  Left ankle pain for 1 week.  Swelling. EXAM: LEFT ANKLE COMPLETE - 3+ VIEW COMPARISON:  Radiographs 3 days ago at [place]: There is no evidence of fracture or dislocation. Ankle mortise is preserved. Joint effusion is now visualized. There is no evidence of arthropathy or other focal bone abnormality. Mild soft tissue edema compared to prior exam. IMPRESSION: Joint effusion and soft tissue edema.  No acute osseous abnormality. Electronically Signed   By: Jeb Levering M.D.   On: 11/30/2017 01:03   Mr Hip Left W Wo Contrast  Result Date: 12/17/2017 CLINICAL DATA:  Left hip pain. Clinical concern for septic arthritis. EXAM: MRI OF THE LEFT HIP WITHOUT AND WITH CONTRAST TECHNIQUE: Multiplanar, multisequence MR imaging was performed both before and after administration of intravenous contrast. CONTRAST:  90m MULTIHANCE GADOBENATE DIMEGLUMINE 529 MG/ML IV SOLN COMPARISON:  None. FINDINGS: Bones: No hip fracture, dislocation or avascular necrosis. No periosteal reaction or bone destruction. No aggressive osseous lesion. Normal sacrum and sacroiliac joints. No SI joint widening or erosive changes. Articular cartilage and labrum Articular cartilage:  No chondral defect. Labrum: Grossly intact, but evaluation is limited by lack of intraarticular fluid. Joint or bursal effusion Joint effusion: No hip joint effusion or synovitis. No SI joint effusion. Bursae:  No bursa formation. Muscles and tendons Flexors: Normal. Extensors: Normal. Abductors: Normal. Adductors: Normal. Gluteals: Normal. Hamstrings: Normal. Other findings Miscellaneous: No pelvic free fluid. No fluid collection or hematoma. No inguinal hernia. Mildly enlarged left inguinal lymph node measuring 12 mm. IMPRESSION: 1. No evidence of septic arthritis of the left hip. 2. No left hip fracture, dislocation or avascular  necrosis. Electronically Signed   By: HKathreen Devoid  On: 12/17/2017 11:55   Mr Foot Left W Wo Contrast  Result Date: 12/19/2017 CLINICAL DATA:  Foot swelling and diabetes. EXAM: MRI OF THE LEFT FOREFOOT WITHOUT AND WITH CONTRAST MRI OF THE LEFT ANKLE WITH AND WITHOUT CONTRAST TECHNIQUE: MULTIPLANAR MULTISEQUENCE MR IMAGING WAS OBTAINED THROUGH THE FOREFOOT BEFORE AND AFTER IV CONTRAST ADMINISTRATION. MULTIPLANAR MULTISEQUENCE MR IMAGING WAS OBTAINED THROUGH THE ANKLE BEFORE AND AFTER IV CONTRAST ADMINISTRATION. COMPARISON:  For/28/19 radiographs FINDINGS: MRI  FOOT FINDINGS Osseous structures: Indistinctly marginated focal edema and enhancement in the set head of the second metatarsal as on image 12/26. No other findings of suspicion for osteomyelitis. Small degenerative lesion laterally in the distal head of the proximal phalanx of the small toe. Ligaments: Lisfranc ligament intact. No appreciable plantar plate injury. Musculotendinous: Edema tracks within and along the plantar musculature of the foot, along with low-grade enhancement. No drainable abscess. Soft tissues: Subcutaneous edema and potentially mild enhancement in the dorsal subcutaneous tissues of the foot favoring cellulitis. No gas tracking in the soft tissues identified. MRI ANKLE FINDINGS Peroneal tendons: Moderate common peroneus tendon sheath tenosynovitis, also with peroneus longus tenosynovitis. Medial flexors: Tibialis posterior and flexor digitorum longus tenosynovitis and tendinopathy. Flexor hallucis longus tenosynovitis just proximal to the knot of Henry's with surrounding mild peritendinitis. Extensor tendons: Unremarkable Achilles tendon: Unremarkable Plantar fascia: Unremarkable Ligaments Lateral ligamentous complex: Ill-defined anterior inferior tibiofibular ligament on images 7-10 of series 19 with extensive surrounding enhancement/inflammatory findings. Somewhat thin anterior talofibular ligament with extensive surrounding  enhancement, possibly from synovitis. Anterolateral impingement not excluded. Medial: Indistinct superomedial portion of the spring ligament. Deltoid ligament grossly intact. Tibiotalar joint: Subtle marrow edema in the medial malleolus and along the site of the growth plate in of the lateral malleolus. No focal osteochondral lesion is observed. 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 Bones: Dorsal beaking of the distal talar head with an effusion of the talonavicular joint. Other: Subcutaneous edema medially and laterally along the ankle tracking into the dorsum of the foot. IMPRESSION: Combined impression for MRI of the ankle and foot: 1. 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 2. Extensive anterolateral inflammatory findings around the anterior inferior tibiofibular ligament and anterior talofibular ligament, suspicious for synovitis, anterolateral impingement not excluded. 3. Considerable flexor tenosynovitis of the peroneal tendons and medial flexors, and occluding tendinopathy of the tibialis posterior and flexor digitorum longus. There is thickening and indistinctness of the superomedial portion of the spring ligament probably from chronic micro injury. 4. Ill-defined focal edema and enhancement in the head of the second metatarsal suggesting stress reaction; osteomyelitis is not readily excluded. 5. Edema tracks within along the plantar musculature of the foot and could be neurogenic or due to myositis. 6. Subcutaneous edema tracking along the dorsum of the foot and in the ankle. This could be bland or due to cellulitis. Electronically Signed   By: Van Clines M.D.   On: 12/19/2017 16:20   Mr Ankle Left W Wo Contrast  Result Date: 12/19/2017 : Please see accession #[id number] for combined forefoot and ankle report. Electronically Signed   By: Van Clines M.D.   On: 12/19/2017 16:34   Dg Foot Complete Left  Result Date: 12/04/2017 CLINICAL DATA:  Pain and swelling in the foot EXAM: LEFT FOOT - COMPLETE 3+ VIEW COMPARISON:  11/30/2017 FINDINGS: There is no evidence of fracture or dislocation. There is no evidence of arthropathy or other focal bone abnormality. Soft tissues are unremarkable. IMPRESSION: Negative. Electronically Signed   By: Donavan Foil M.D.   On: 12/04/2017 23:43   Dg Foot Complete Right  Result Date: 12/18/2017 CLINICAL DATA:  Pain along second third and fourth MTP joints for  3 days. History of recent cellulitis. No trauma. EXAM: RIGHT FOOT COMPLETE - 3+ VIEW COMPARISON:  None. FINDINGS: No fractures or dislocations. No soft tissue gas identified. No bony erosion. IMPRESSION: No acute abnormality noted. Electronically Signed   By: Dorise Bullion III M.D   On: 12/18/2017 14:41   Dg Hip Unilat W Or Wo Pelvis 2-3 Views Left  Result Date: 12/17/2017 CLINICAL DATA:  Septic arthritis follow-up EXAM: DG HIP (WITH OR WITHOUT PELVIS) 2-3V LEFT COMPARISON:  None. FINDINGS: There is no evidence of hip fracture or dislocation. Hip joints are maintained without soft tissue swelling to suggest joint effusion. No frank bone destruction. There is no evidence of arthropathy or other focal bone abnormality. IMPRESSION: Negative. Electronically Signed   By: Ashley Royalty M.D.   On: 12/17/2017 03:23   [place] Ekg Site Rite  Result Date: 12/20/2017 If Site Rite image not attached, placement could not be confirmed due to current cardiac rhythm.  Dg Knee 3 Views Left  Result Date: 12/07/2017 CLINICAL DATA:  Left knee pain for several days, no known injury, initial encounter EXAM: LEFT KNEE - 2 VIEW COMPARISON:  None. FINDINGS: No acute  fracture or dislocation is noted. Joint effusion is seen. No other focal abnormality is noted. IMPRESSION: Joint effusion, no acute fracture. Electronically Signed   By: Inez Catalina M.D.   On: 12/07/2017 09:37    Microbiology: Recent Results (from the past 240 hour(s))  Blood culture (routine x 2)     Status: None   Collection Time: 12/17/17  2:15 AM  Result Value Ref Range Status   Specimen Description BLOOD RIGHT ANTECUBITAL  Final   Special Requests   Final    BOTTLES DRAWN AEROBIC AND ANAEROBIC Blood Culture adequate volume   Culture   Final    NO GROWTH 5 DAYS Performed at [facility], [address], [place], [place] [address]    Report Status 12/22/2017 FINAL  Final  Culture, blood (Routine X 2) w Reflex to ID Panel     Status: None   Collection Time: 12/17/17  2:20 AM  Result Value Ref Range Status   Specimen Description BLOOD LEFT ANTECUBITAL  Final   Special Requests   Final    BOTTLES DRAWN AEROBIC AND ANAEROBIC Blood Culture adequate volume   Culture   Final    NO GROWTH 5 DAYS Performed at [facility], [place] [address] [place], [place] [address]    Report Status 12/22/2017 FINAL  Final     Labs: Basic Metabolic Panel: Recent Labs  Lab 12/17/17 0219 12/18/17 0414 12/21/17 1131  NA 136 138 136  K 4.0 4.2 3.9  CL 102 103 98  CO2 _0 GLUCOSE 112* 105* 113*  BUN _1 CREATININE 0.77 0.77 0.69  CALCIUM 9.3 9.4 9.2   Liver Function Tests: Recent Labs  Lab 12/17/17 0219  AST 19  ALT 21  ALKPHOS 60  BILITOT 0.7  PROT 7.2  ALBUMIN 3.1*   No results for input(s): LIPASE, AMYLASE in the last 168 hours. No results for input(s): AMMONIA in the last 168 hours. CBC: Recent Labs  Lab 12/17/17 0219 12/18/17 0414 12/21/17 0415  WBC 10.8* 9.1 7.9  NEUTROABS 7.3  --   --   HGB 13.0 12.1* 12.0*  HCT 38.9* 37.1* 36.6*  MCV 89.6 90.9 89.9  PLT 476* 442* 522*   Cardiac Enzymes: No results for input(s): CKTOTAL, CKMB, CKMBINDEX,  TROPONINI in the  last 168 hours. BNP: BNP (last 3 results) No results for input(s): BNP in the last 8760 hours.  ProBNP (last 3 results) No results for input(s): PROBNP in the last 8760 hours.  CBG: No results for input(s): GLUCAP in the last 168 hours.     Signed:  Nita Sells MD   [facility] 12/22/2017, 2:27 PM

## 2017-12-24 ENCOUNTER — Other Ambulatory Visit: Payer: Self-pay | Admitting: *Deleted

## 2017-12-24 NOTE — Patient Outreach (Addendum)
[facility] Care Management  12/24/2017  Michael Welch 05-13-1996 [id number]  Notified today by [facility] Care Management hospital liaison Charlesetta ShanksVictoria Brewer of this patient's hospital discharge on 7/3. Patient was previously hospitalized from 6/17-6/19 for septic arthritis of his left knee and ankle and was also seen in the emergency department of [facility] [place] on 6/26 and 6/27. He left against medical advice on the 12/16/17 emergency department visit.  Spoke with [place] via home phone number and transition of care assessment completed. See the transition of care template for details.  [name] was [facility]17 for septic arthritis of his left ankle, he was discharged home on 12/22/17 with home health services provided by [facility] for completion of IV Rocephin and IV Vancomycin. HE is status post left knee and left ankle arthroscopic irrigation and debridement on 6/18 related to septic arthritis. [name] states he has been administering the antibiotics without any problems after he received instruction buy the [facility] nurse. He says his pain is well controlled with the prescribed medicines and he denies fever, chills, or bowel or bladder issues.  He was not aware that he had a follow up appointment with Dr. Orvan Falconerampbell in infectious disease on June 18th at 10:45. Provided appointment information and location of Dr Blair Dolphinampbell's office. He states he has a follow up appointment with his surgeon on 12/29/17.  Ensured that [name] has this RNCM's contact information should he have questions or concerns in the future. No other care management needs identified so will close case to [facility] Care Management services.     Bary RichardJanet S. Mossie Gilder RN,CCM,CDE [facility] Care Management Coordinator Office Phone [phone] Office Fax [phone]

## 2017-12-27 ENCOUNTER — Encounter: Payer: Self-pay | Admitting: *Deleted

## 2017-12-31 ENCOUNTER — Other Ambulatory Visit: Payer: Self-pay | Admitting: Pharmacist

## 2017-12-31 NOTE — Progress Notes (Signed)
[facility] pharmacy lab review  

## 2018-01-06 ENCOUNTER — Inpatient Hospital Stay: Payer: Managed Care, Other (non HMO) | Admitting: Internal Medicine

## 2018-01-07 ENCOUNTER — Other Ambulatory Visit: Payer: Self-pay | Admitting: Pharmacist

## 2018-01-07 NOTE — Progress Notes (Signed)
[facility] pharmacy lab review  

## 2018-01-17 ENCOUNTER — Encounter: Payer: Self-pay | Admitting: Internal Medicine

## 2018-01-17 ENCOUNTER — Ambulatory Visit (INDEPENDENT_AMBULATORY_CARE_PROVIDER_SITE_OTHER): Payer: Managed Care, Other (non HMO) | Admitting: Internal Medicine

## 2018-01-17 DIAGNOSIS — M009 Pyogenic arthritis, unspecified: Secondary | ICD-10-CM

## 2018-01-17 NOTE — Assessment & Plan Note (Signed)
I do not think there is any active infection at this time.  I will continue observation off of antibiotics and see him back in 4 weeks.

## 2018-01-17 NOTE — Progress Notes (Signed)
[facility]  Patient Active Problem List   Diagnosis Date Noted  . 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 Septic arthritis (HCC) 12/07/2017    Priority: High  . Tobacco abuse 12/17/2017  . History of gonorrhea   . SIRS (systemic inflammatory response syndrome) (HCC) 12/07/2017    Patient's Medications  New Prescriptions   No medications on file  Previous Medications   ACETAMINOPHEN (TYLENOL) 325 MG TABLET    Take 2 tablets (650 mg total) by mouth every 6 (six) hours as needed for mild pain (or Fever >/= 101).   ASPIRIN-ACETAMINOPHEN-CAFFEINE (EXCEDRIN MIGRAINE PO)    Take 1 tablet by mouth 3 (three) times daily as needed (for migraines or headaches).    DICLOFENAC SODIUM (VOLTAREN) 1 % GEL    Apply 2 g topically 4 (four) times daily as needed (to painful sites, as directed).    HYPROM-NAPHAZ-POLYSORB-ZN SULF (CLEAR EYES COMPLETE OP)    Apply 2 drops to eye 3 (three) times daily as needed (to affected eye for relief of redness).   NAPROXEN (NAPROSYN) 500 MG TABLET    Take by mouth.   NICOTINE (NICODERM CQ - DOSED IN MG/24 HOURS) 14 MG/24HR PATCH    Place 1 patch (14 mg total) onto the skin daily.   OXYCODONE (OXYCONTIN) 10 MG 12 HR TABLET    Take 1 tablet (10 mg total) by mouth every 12 (twelve) hours.   SENNA-DOCUSATE (SENOKOT-S) 8.6-50 MG TABLET    Take 1 tablet by mouth at bedtime as needed for mild constipation.  Modified Medications   No medications on file  Discontinued Medications   No medications on file    Subjective: Michael Welch is in with his wife for his hospital follow-up visit.  He recently developed pain, swelling and effusion of his left knee and ankle.  This occurred after he was diagnosed and treated for gonorrhea with ceftriaxone and azithromycin.  He was hospitalized and underwent incision and drainage of both joints.  Synovial fluid cultures were negative and no organisms were seen on Gram stain.  He did not want to  receive IV antibiotics so he was discharged on oral doxycycline which he took.  However he was readmitted to the hospital about 10 days later.  He was then started on vancomycin and ceftriaxone.  He completed 20 days of therapy on 01/04/2018.  He had no problems tolerating his PICC or antibiotics.  He says that his pain and swelling of his joints is markedly improved since before his initial surgery but he is still having pain, especially in his left knee.  He is taking ibuprofen.  He has not had any fever, chills or sweats.  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  Review of Systems: Review of Systems  Constitutional: Negative for chills, diaphoresis and fever.  Gastrointestinal: Negative for abdominal pain, diarrhea, nausea and vomiting.  Musculoskeletal: Positive for joint pain.  Skin: Negative for rash.    Past Medical History:  Diagnosis Date  . MRSA (methicillin resistant Staphylococcus aureus)     Social History   Tobacco Use  . Smoking status: Current Every Day Smoker    Packs/day: 0.50  . Smokeless tobacco: Never Used  Substance Use Topics  . Alcohol use: No  . Drug use: No    Family History  Problem Relation Age of Onset  . Healthy Mother   . Healthy Father   .  Crohn's disease Neg Hx     Allergies  Allergen Reactions  . Sulfa Antibiotics Anaphylaxis, Nausea And Vomiting and Swelling    Angioedema also  . Clindamycin/Lincomycin Itching    Pt's mother stated that Pt gets red man syndrome with clindamycin, would need benadryl if Pt is to get this med.  . Other Hives    (Sweet) green peas    Objective: Vitals:   01/17/18 0917  BP: 121/83  Pulse: (!) 105  Temp: 98.6 F (37 C)  TempSrc: Oral  Weight: 161 lb (73 kg)   Body mass index is 21.25 kg/m.  Physical Exam  Constitutional: He is oriented to person, place, and time. No distress.  Musculoskeletal:  He has mild swelling and warmth of his left knee.  Flexion and extension are somewhat limited  by pain.  He is still using crutches.  Neurological: He is alert and oriented to person, place, and time.  Skin: No rash noted.  Former right arm PICC site is healing well.    Lab Results    Problem List Items Addressed This Visit      High   Septic arthritis (HCC)    I do not think there is any active infection at this time.  I will continue observation off of antibiotics and see him back in 4 weeks.      Relevant Medications   naproxen (NAPROSYN) 500 MG tablet       Cliffton AstersJohn Lorenda Grecco, MD [facility] [phone] pager   [phone] 01/17/2018, 9:34 AM

## 2018-02-12 ENCOUNTER — Encounter (HOSPITAL_COMMUNITY): Payer: Self-pay | Admitting: Orthopedic Surgery

## 2018-02-12 NOTE — OR Nursing (Signed)
Late entry due to correction of data entry error.  

## 2018-02-28 ENCOUNTER — Ambulatory Visit: Payer: Managed Care, Other (non HMO) | Admitting: Internal Medicine

## 2018-03-21 ENCOUNTER — Ambulatory Visit: Payer: Managed Care, Other (non HMO) | Admitting: Internal Medicine

## 2018-10-12 ENCOUNTER — Encounter: Payer: Self-pay | Admitting: Gastroenterology

## 2018-10-18 ENCOUNTER — Ambulatory Visit: Payer: Managed Care, Other (non HMO) | Admitting: Nurse Practitioner

## 2018-10-18 ENCOUNTER — Telehealth: Payer: Self-pay

## 2018-10-18 ENCOUNTER — Encounter: Payer: Self-pay | Admitting: Internal Medicine

## 2018-10-18 ENCOUNTER — Other Ambulatory Visit: Payer: Self-pay

## 2018-10-18 ENCOUNTER — Telehealth: Payer: Self-pay | Admitting: Internal Medicine

## 2018-10-18 NOTE — Progress Notes (Signed)
Patient no show or cancelled 

## 2018-10-18 NOTE — Telephone Encounter (Signed)
Patient was a no answer and letter sent

## 2018-10-18 NOTE — Telephone Encounter (Signed)
done

## 2018-10-18 NOTE — Telephone Encounter (Signed)
Tried to call pt at 8:37am to start [employer] visit w/EG, no answer, LMOVM for return call. Tried to call pt at 8:49am, no answer.   Stacey, please no show pt for appt.

## 2019-09-18 IMAGING — DX DG ANKLE COMPLETE 3+V*L*
3 series · 3 of 3 positions shown · non-contrast
Comparison: Radiographs 3 days ago at [HOSPITAL] [place].

CLINICAL DATA: Left ankle pain for 1 week.  Swelling.

EXAM:
LEFT ANKLE COMPLETE - 3+ VIEW

[ankle ap]
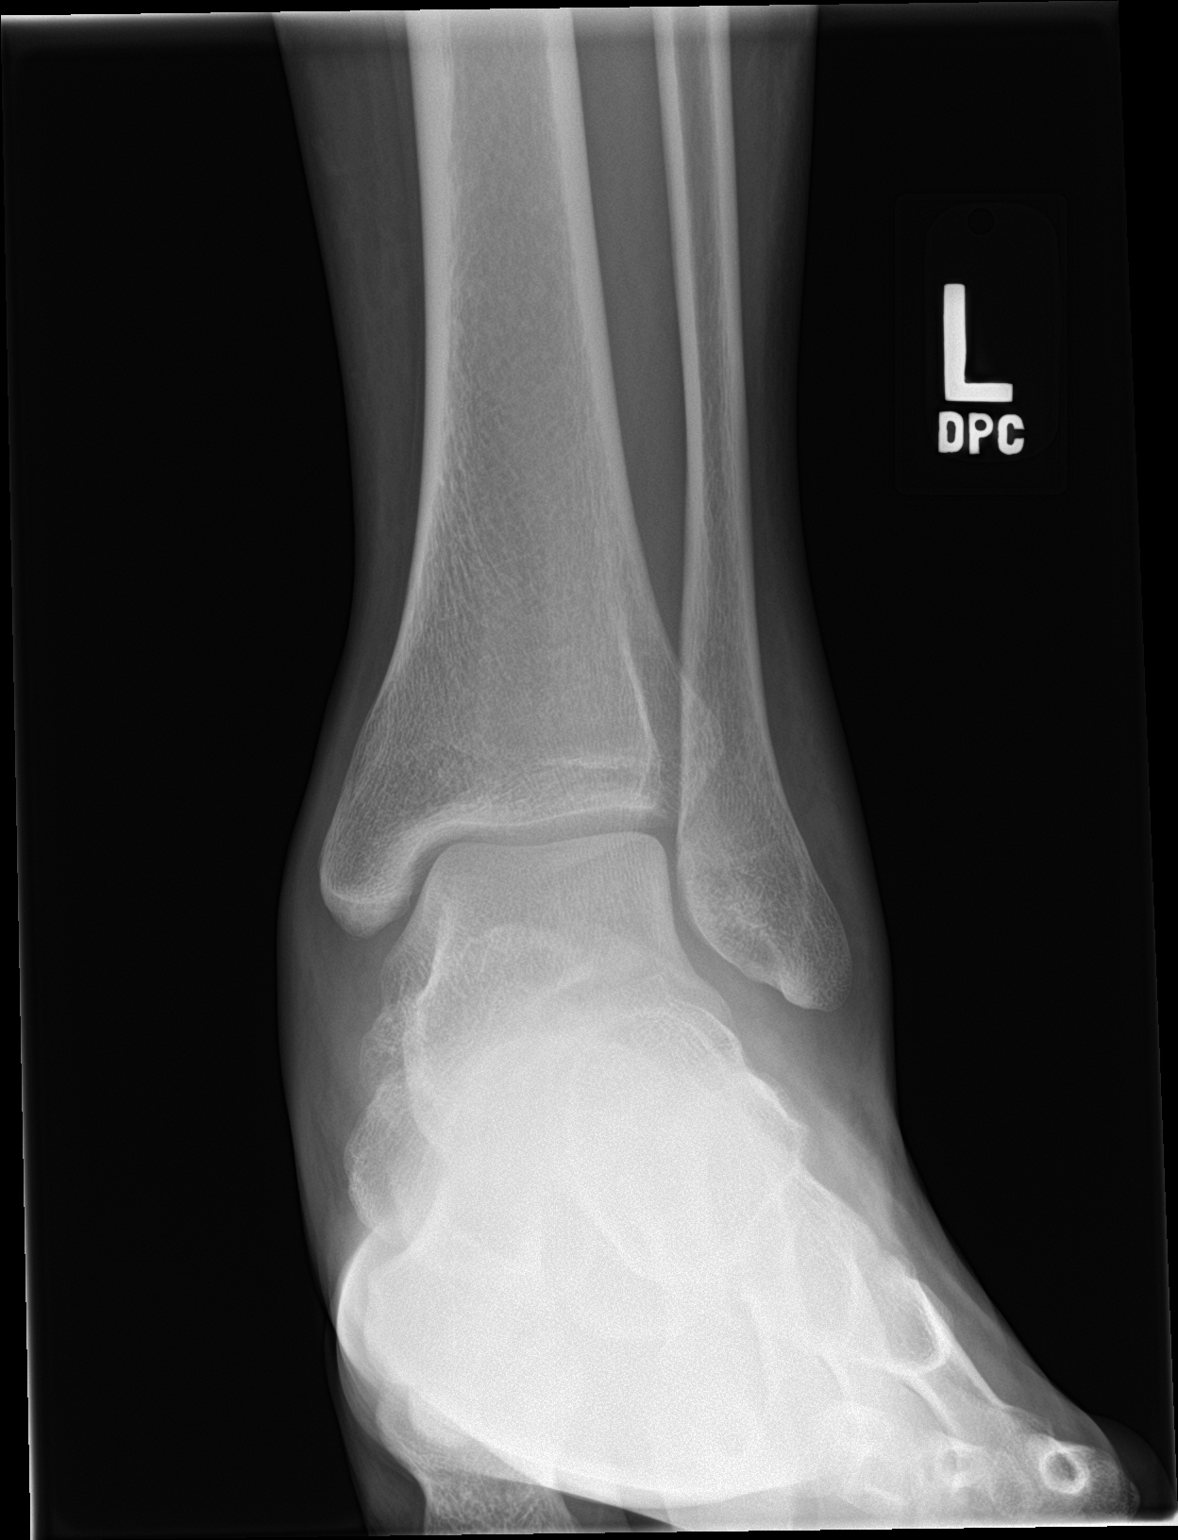

[ankle obl]
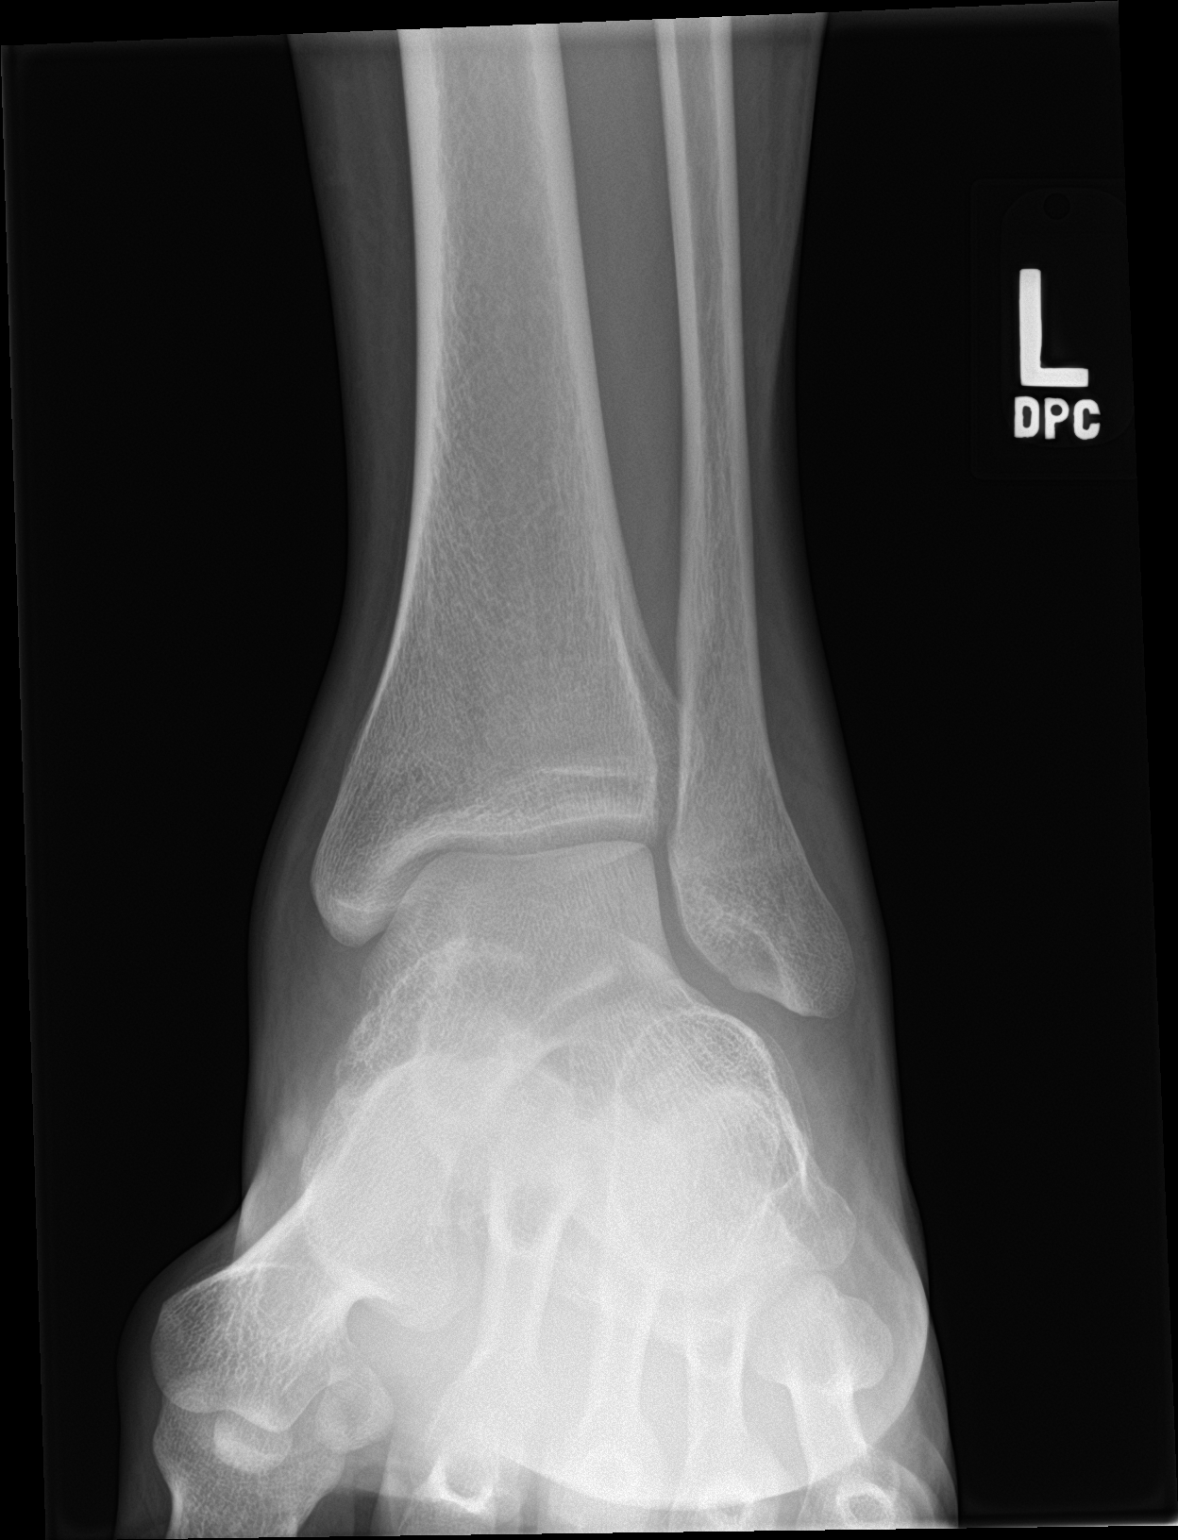

[ankle lat]
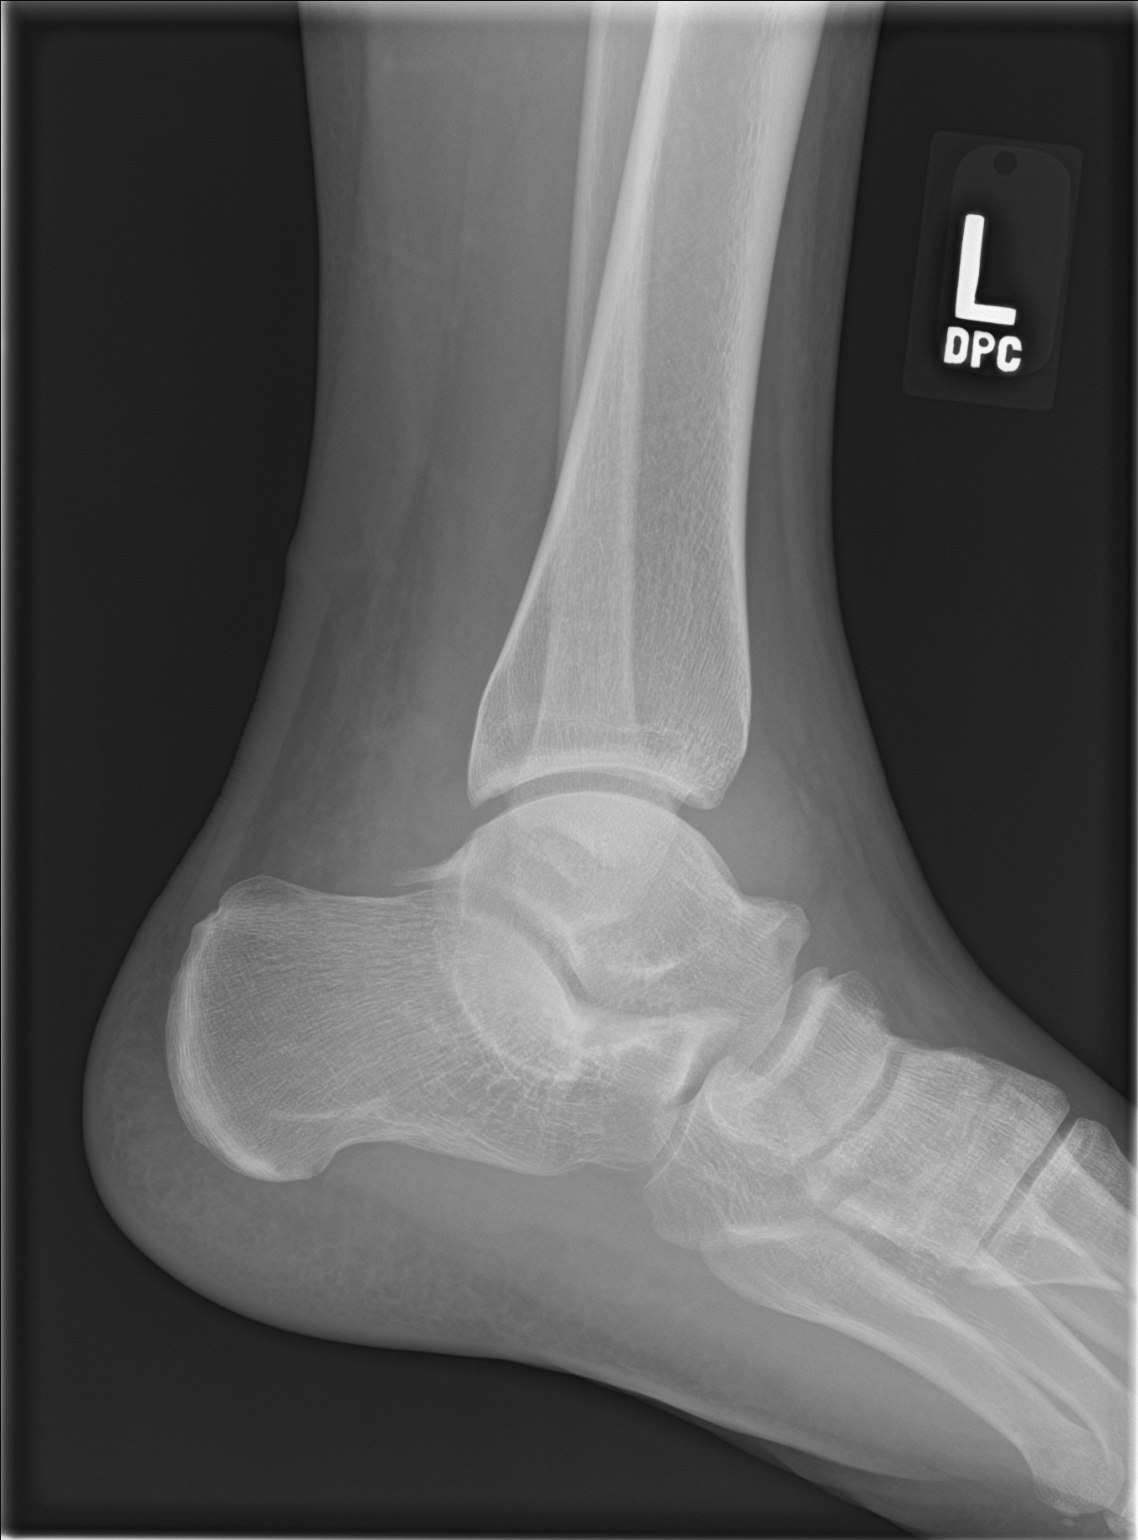

[3 of 3 positions shown; findings below may reference images not displayed]

FINDINGS: There is no evidence of fracture or dislocation. Ankle mortise is
preserved. Joint effusion is now visualized. There is no evidence of
arthropathy or other focal bone abnormality. Mild soft tissue edema
compared to prior exam.
IMPRESSION: Joint effusion and soft tissue edema.  No acute osseous abnormality.

## 2019-09-19 ENCOUNTER — Other Ambulatory Visit: Payer: Self-pay

## 2019-09-19 ENCOUNTER — Emergency Department (HOSPITAL_COMMUNITY): Payer: Managed Care, Other (non HMO)

## 2019-09-19 ENCOUNTER — Encounter (HOSPITAL_COMMUNITY): Payer: Self-pay | Admitting: *Deleted

## 2019-09-19 ENCOUNTER — Emergency Department (HOSPITAL_COMMUNITY)
Admission: EM | Admit: 2019-09-19 | Discharge: 2019-09-19 | Disposition: A | Discharge status: Home / Self Care | Payer: Managed Care, Other (non HMO) | Attending: Emergency Medicine | Admitting: Emergency Medicine

## 2019-09-19 DIAGNOSIS — S3992XA Unspecified injury of lower back, initial encounter: Secondary | ICD-10-CM | POA: Diagnosis present

## 2019-09-19 DIAGNOSIS — S299XXA Unspecified injury of thorax, initial encounter: Secondary | ICD-10-CM | POA: Diagnosis not present

## 2019-09-19 DIAGNOSIS — W2211XA Striking against or struck by driver side automobile airbag, initial encounter: Secondary | ICD-10-CM | POA: Insufficient documentation

## 2019-09-19 DIAGNOSIS — Y999 Unspecified external cause status: Secondary | ICD-10-CM | POA: Insufficient documentation

## 2019-09-19 DIAGNOSIS — Y92410 Unspecified street and highway as the place of occurrence of the external cause: Secondary | ICD-10-CM | POA: Insufficient documentation

## 2019-09-19 DIAGNOSIS — S39012A Strain of muscle, fascia and tendon of lower back, initial encounter: Secondary | ICD-10-CM | POA: Diagnosis not present

## 2019-09-19 DIAGNOSIS — R0789 Other chest pain: Secondary | ICD-10-CM | POA: Diagnosis not present

## 2019-09-19 DIAGNOSIS — F17210 Nicotine dependence, cigarettes, uncomplicated: Secondary | ICD-10-CM | POA: Insufficient documentation

## 2019-09-19 DIAGNOSIS — Y9389 Activity, other specified: Secondary | ICD-10-CM | POA: Insufficient documentation

## 2019-09-19 DIAGNOSIS — M130 Polyarthritis, unspecified: Secondary | ICD-10-CM | POA: Diagnosis not present

## 2019-09-19 MED ORDER — IBUPROFEN 800 MG PO TABS: 800.0000 mg | ORAL_TABLET | Freq: Three times a day (TID) | ORAL | 0 refills | Status: AC

## 2019-09-19 MED ORDER — METHOCARBAMOL 500 MG PO TABS: 500.0000 mg | ORAL_TABLET | Freq: Three times a day (TID) | ORAL | 0 refills | Status: AC
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
## 2019-09-19 NOTE — Discharge Instructions (Addendum)
You may alternate ice and heat to your back and chest.  Take the medication as directed.  Follow-up with your primary doctor for recheck or return to the emergency department for any worsening symptoms.

## 2019-09-22 IMAGING — DX DG FOOT COMPLETE 3+V*L*
3 series · 3 of 3 positions shown · non-contrast
Comparison: 11/30/2017

CLINICAL DATA: Pain and swelling in the foot

EXAM:
LEFT FOOT - COMPLETE 3+ VIEW

[foot ap]
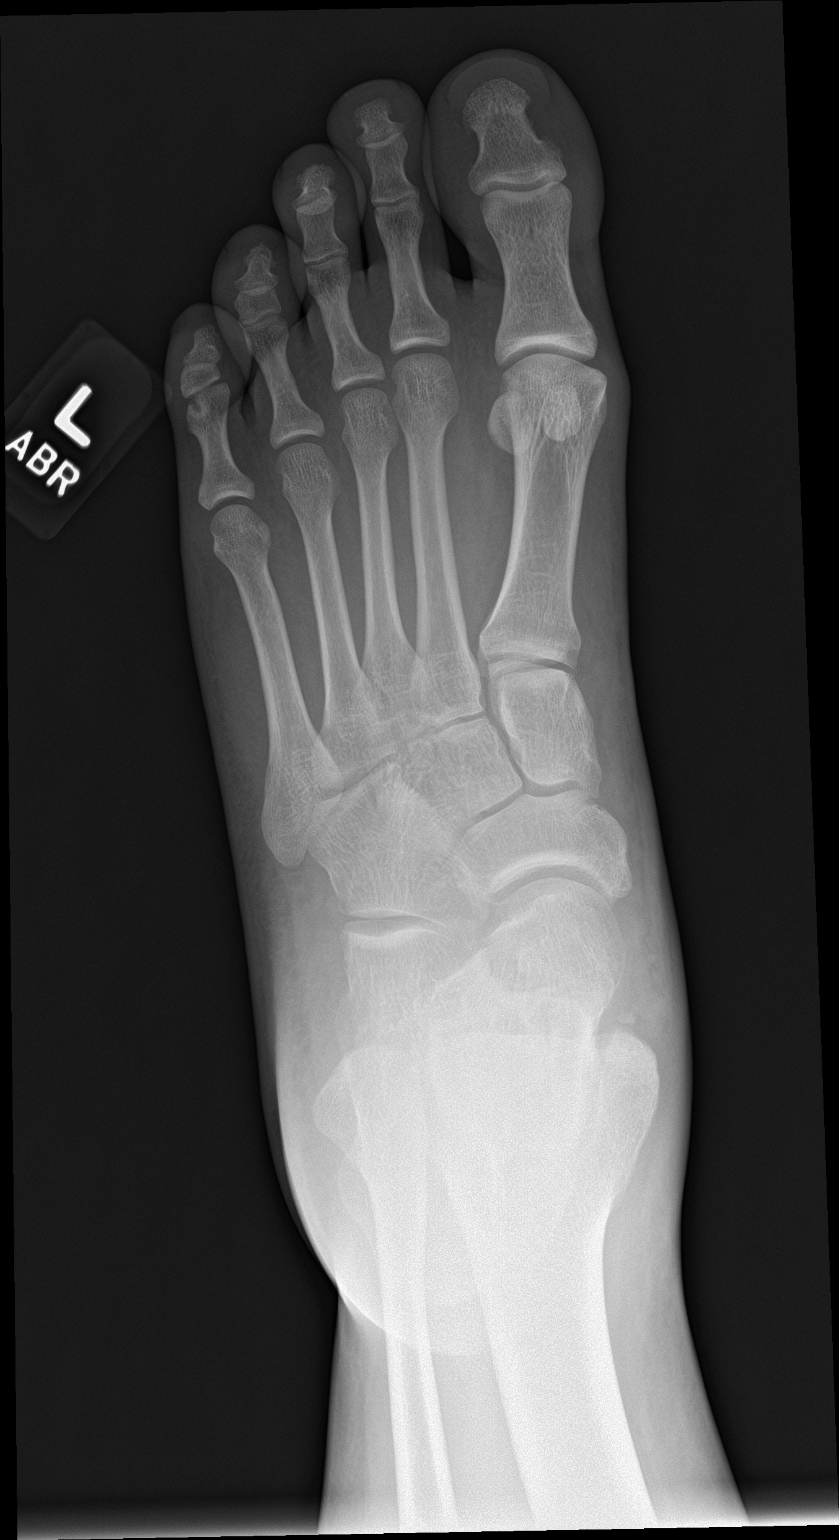

[foot obl]
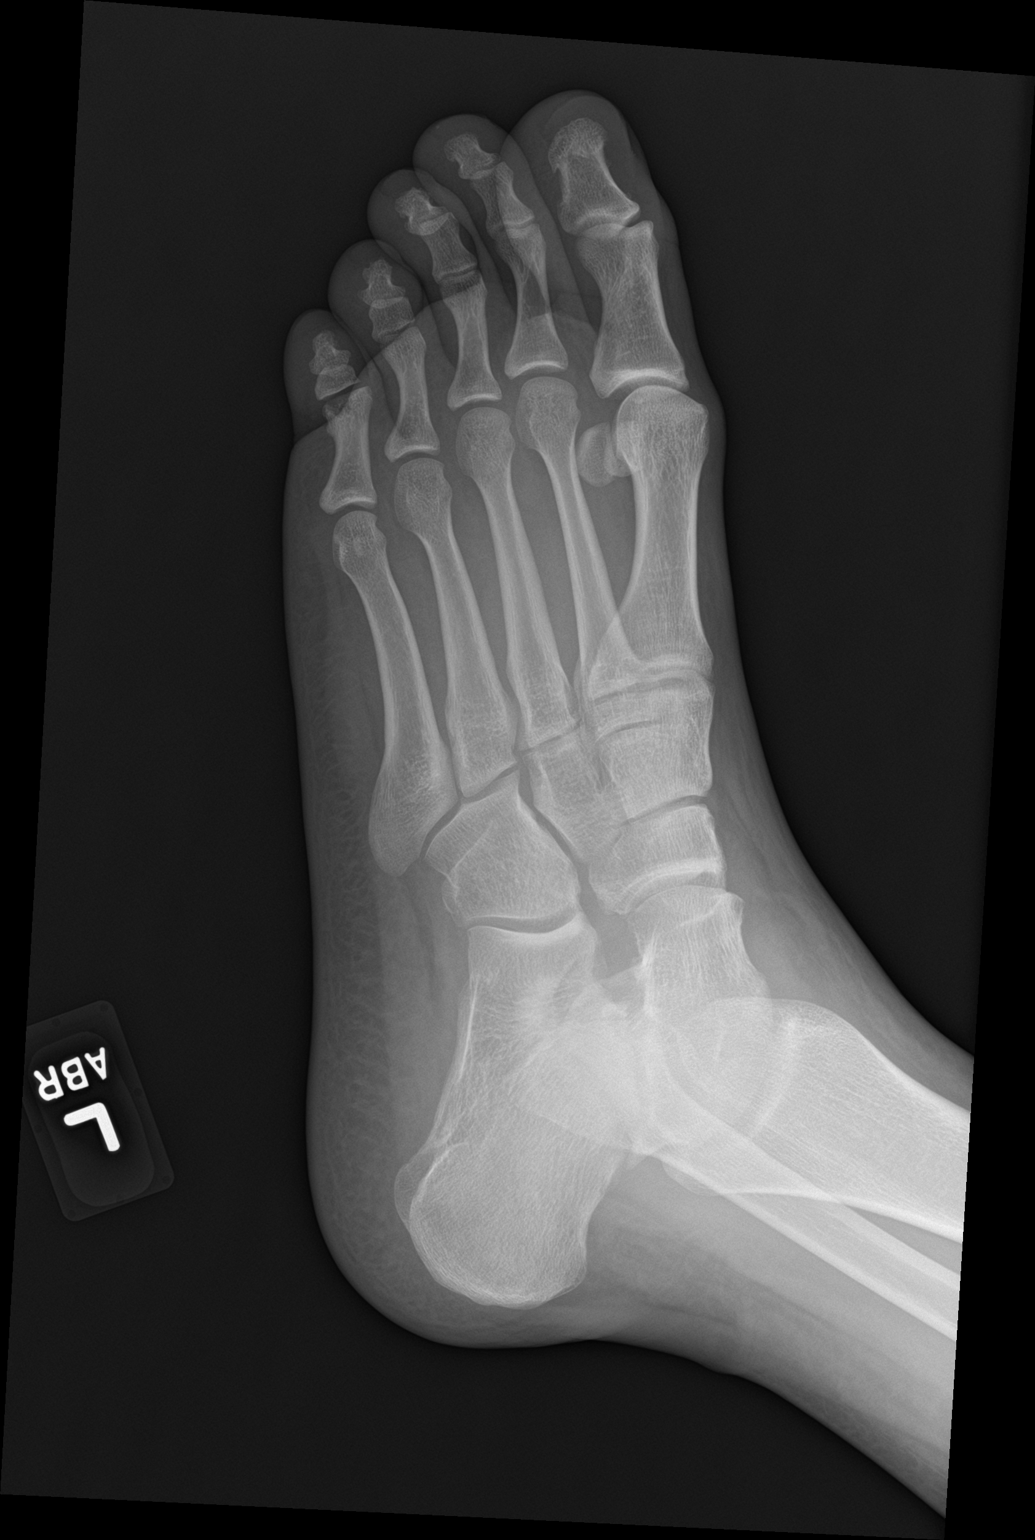

[foot lat]
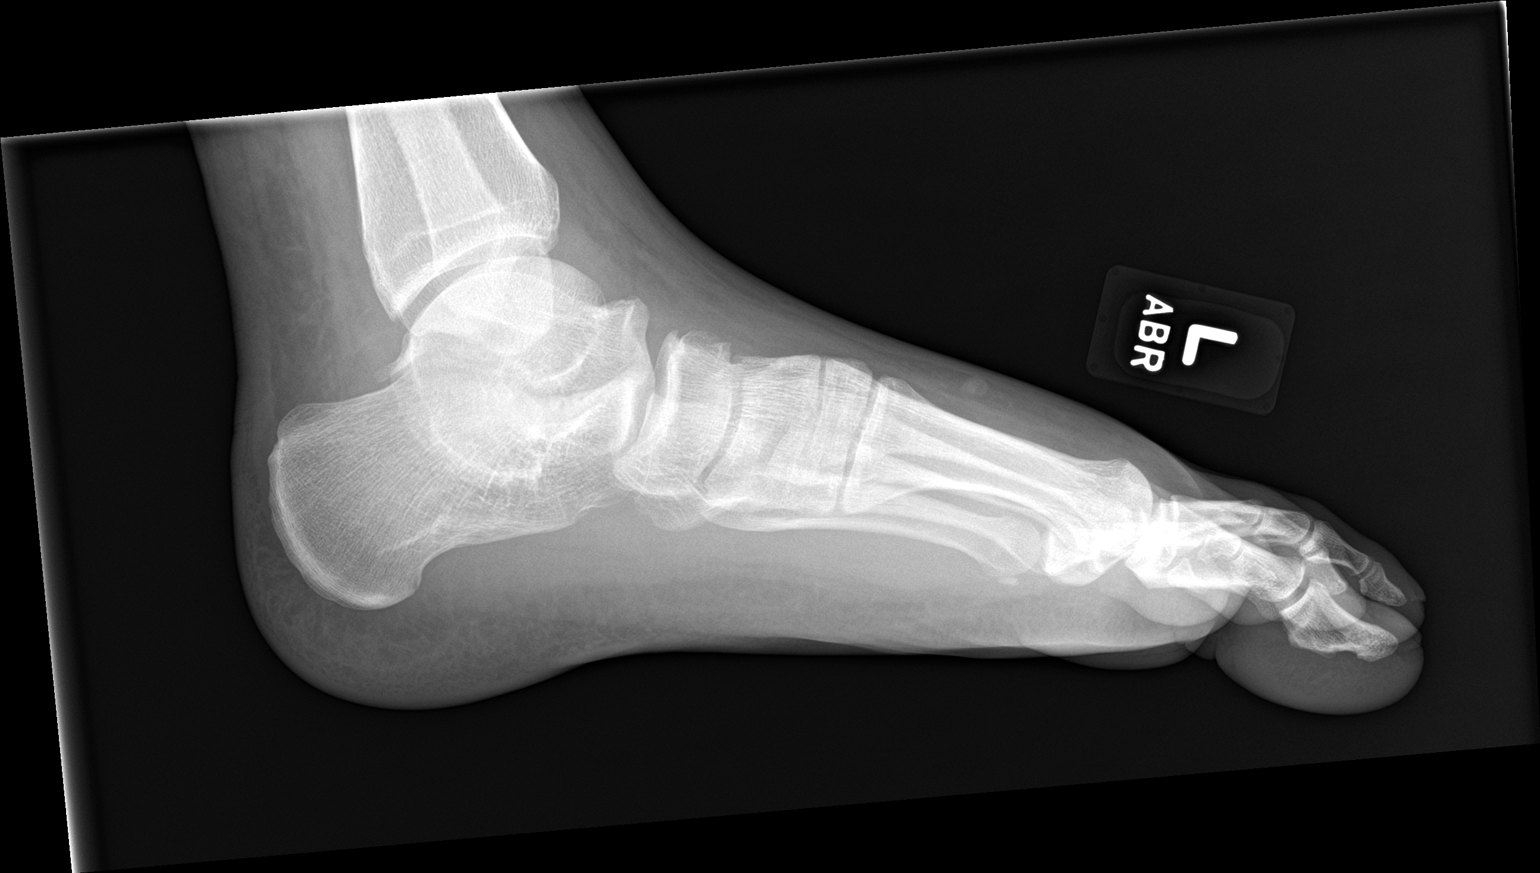

[3 of 3 positions shown; findings below may reference images not displayed]

FINDINGS: There is no evidence of fracture or dislocation. There is no
evidence of arthropathy or other focal bone abnormality. Soft
tissues are unremarkable.
IMPRESSION: Negative.
